# Patient Record
Sex: Female | Born: 1935 | Race: White | Smoking: Never smoker
Health system: Southern US, Community
[De-identification: ages and names within clinical notes are randomized; demographics above are authoritative.]

## PROBLEM LIST (undated history)

## (undated) DIAGNOSIS — K5900 Constipation, unspecified: Secondary | ICD-10-CM

## (undated) DIAGNOSIS — F0390 Unspecified dementia without behavioral disturbance: Secondary | ICD-10-CM

## (undated) DIAGNOSIS — M329 Systemic lupus erythematosus, unspecified: Secondary | ICD-10-CM

## (undated) DIAGNOSIS — N200 Calculus of kidney: Secondary | ICD-10-CM

## (undated) DIAGNOSIS — F32A Depression, unspecified: Secondary | ICD-10-CM

## (undated) DIAGNOSIS — F329 Major depressive disorder, single episode, unspecified: Secondary | ICD-10-CM

## (undated) DIAGNOSIS — R000 Tachycardia, unspecified: Secondary | ICD-10-CM

---

## 1998-05-12 HISTORY — PX: CATARACT EXTRACTION: SUR2

## 2002-05-12 HISTORY — PX: AORTIC VALVE REPLACEMENT: SHX41

## 2003-07-28 ENCOUNTER — Other Ambulatory Visit: Payer: Self-pay

## 2004-07-25 ENCOUNTER — Ambulatory Visit: Payer: Self-pay | Admitting: Internal Medicine

## 2004-08-28 ENCOUNTER — Ambulatory Visit: Payer: Self-pay | Admitting: Internal Medicine

## 2004-09-05 ENCOUNTER — Ambulatory Visit: Payer: Self-pay | Admitting: Psychiatry

## 2005-10-07 ENCOUNTER — Ambulatory Visit: Payer: Self-pay | Admitting: Internal Medicine

## 2006-06-01 ENCOUNTER — Ambulatory Visit: Payer: Self-pay | Admitting: Orthopaedic Surgery

## 2006-10-13 ENCOUNTER — Ambulatory Visit: Payer: Self-pay | Admitting: Internal Medicine

## 2006-11-11 ENCOUNTER — Ambulatory Visit: Payer: Self-pay | Admitting: Orthopaedic Surgery

## 2007-10-15 ENCOUNTER — Ambulatory Visit: Payer: Self-pay | Admitting: Internal Medicine

## 2008-10-16 ENCOUNTER — Ambulatory Visit: Payer: Self-pay | Admitting: Internal Medicine

## 2008-10-18 ENCOUNTER — Ambulatory Visit: Payer: Self-pay | Admitting: Internal Medicine

## 2010-06-12 DIAGNOSIS — I872 Venous insufficiency (chronic) (peripheral): Secondary | ICD-10-CM

## 2010-06-12 DIAGNOSIS — F0280 Dementia in other diseases classified elsewhere without behavioral disturbance: Secondary | ICD-10-CM

## 2010-06-12 DIAGNOSIS — G309 Alzheimer's disease, unspecified: Secondary | ICD-10-CM

## 2010-06-12 DIAGNOSIS — F411 Generalized anxiety disorder: Secondary | ICD-10-CM

## 2010-06-14 DIAGNOSIS — L03039 Cellulitis of unspecified toe: Secondary | ICD-10-CM

## 2010-06-14 DIAGNOSIS — L02619 Cutaneous abscess of unspecified foot: Secondary | ICD-10-CM

## 2010-07-17 ENCOUNTER — Telehealth: Payer: Self-pay | Admitting: Family Medicine

## 2010-07-17 DIAGNOSIS — I10 Essential (primary) hypertension: Secondary | ICD-10-CM

## 2010-07-23 NOTE — Progress Notes (Signed)
Summary: call a nurse   Phone Note Call from Patient   Summary of Call: Triage Record Num: [id number] Operator: Audelia Hives Patient Name: Kathryn Higgins Call Date & Time: 07/16/2010 10:22:58PM Patient Phone: [phone] PCP: Tillman Abide Patient Gender: Female PCP Fax : [phone] Patient DOB: 27-Oct-1923 Practice Name: [facility] Reason for Call: F/U call done, pts BP is 105/72 pulse 117. Clonidine 0.1 mg was given at 2020. At 2120 BP 110/73, no facial flushing noted. Advised to F/U with rounding MD 07/17/10. Protocol(s) Used: Office Note Recommended Outcome per Protocol: Information Noted and Sent to Office Reason for Outcome: Caller information to office Care Advice:  ~ 03/ Initial call taken by: Melody Comas,  July 17, 2010 8:37 AM    Noted.  Will follow up today. Ruthe Mannan MD  July 17, 2010 8:40 AM

## 2010-07-24 DIAGNOSIS — R000 Tachycardia, unspecified: Secondary | ICD-10-CM

## 2010-08-23 DIAGNOSIS — F329 Major depressive disorder, single episode, unspecified: Secondary | ICD-10-CM

## 2010-08-23 DIAGNOSIS — F0281 Dementia in other diseases classified elsewhere with behavioral disturbance: Secondary | ICD-10-CM

## 2010-08-23 DIAGNOSIS — I479 Paroxysmal tachycardia, unspecified: Secondary | ICD-10-CM

## 2010-10-08 DIAGNOSIS — F0280 Dementia in other diseases classified elsewhere without behavioral disturbance: Secondary | ICD-10-CM

## 2010-10-08 DIAGNOSIS — G309 Alzheimer's disease, unspecified: Secondary | ICD-10-CM

## 2010-10-08 DIAGNOSIS — F411 Generalized anxiety disorder: Secondary | ICD-10-CM

## 2010-10-08 DIAGNOSIS — M329 Systemic lupus erythematosus, unspecified: Secondary | ICD-10-CM

## 2010-10-08 DIAGNOSIS — F22 Delusional disorders: Secondary | ICD-10-CM

## 2010-11-14 DIAGNOSIS — J069 Acute upper respiratory infection, unspecified: Secondary | ICD-10-CM

## 2010-11-18 DIAGNOSIS — N6481 Ptosis of breast: Secondary | ICD-10-CM

## 2010-11-27 ENCOUNTER — Ambulatory Visit: Payer: Self-pay | Admitting: Internal Medicine

## 2010-12-06 DIAGNOSIS — J069 Acute upper respiratory infection, unspecified: Secondary | ICD-10-CM

## 2010-12-09 DIAGNOSIS — J069 Acute upper respiratory infection, unspecified: Secondary | ICD-10-CM

## 2011-02-03 DIAGNOSIS — R000 Tachycardia, unspecified: Secondary | ICD-10-CM

## 2011-02-03 DIAGNOSIS — F411 Generalized anxiety disorder: Secondary | ICD-10-CM

## 2011-02-03 DIAGNOSIS — G309 Alzheimer's disease, unspecified: Secondary | ICD-10-CM

## 2011-02-03 DIAGNOSIS — F22 Delusional disorders: Secondary | ICD-10-CM

## 2011-02-03 DIAGNOSIS — F0280 Dementia in other diseases classified elsewhere without behavioral disturbance: Secondary | ICD-10-CM

## 2011-04-04 ENCOUNTER — Telehealth: Payer: Self-pay | Admitting: *Deleted

## 2011-04-04 NOTE — Telephone Encounter (Signed)
Prior Kathryn Higgins is needed for namenda, form is on your desk.  I dont know who originated this.

## 2011-04-07 DIAGNOSIS — M79609 Pain in unspecified limb: Secondary | ICD-10-CM

## 2011-04-07 NOTE — Telephone Encounter (Signed)
Form faxed back and to [facility].

## 2011-04-07 NOTE — Telephone Encounter (Signed)
Form done Please fax back and fax copy to [employer] also

## 2011-04-16 DIAGNOSIS — F329 Major depressive disorder, single episode, unspecified: Secondary | ICD-10-CM

## 2011-06-03 DIAGNOSIS — F22 Delusional disorders: Secondary | ICD-10-CM

## 2011-06-03 DIAGNOSIS — G309 Alzheimer's disease, unspecified: Secondary | ICD-10-CM

## 2011-06-03 DIAGNOSIS — F411 Generalized anxiety disorder: Secondary | ICD-10-CM

## 2011-06-03 DIAGNOSIS — R000 Tachycardia, unspecified: Secondary | ICD-10-CM

## 2011-06-03 DIAGNOSIS — F0280 Dementia in other diseases classified elsewhere without behavioral disturbance: Secondary | ICD-10-CM

## 2011-07-18 DIAGNOSIS — F0281 Dementia in other diseases classified elsewhere with behavioral disturbance: Secondary | ICD-10-CM

## 2011-07-18 DIAGNOSIS — F0280 Dementia in other diseases classified elsewhere without behavioral disturbance: Secondary | ICD-10-CM

## 2011-07-18 DIAGNOSIS — F411 Generalized anxiety disorder: Secondary | ICD-10-CM

## 2011-08-06 ENCOUNTER — Inpatient Hospital Stay: Payer: Self-pay | Admitting: Orthopedic Surgery

## 2011-08-06 LAB — URINALYSIS, COMPLETE
Bilirubin,UR: NEGATIVE
Glucose,UR: NEGATIVE mg/dL (ref 0–75)
Ketone: NEGATIVE
Ph: 6 (ref 4.5–8.0)
Protein: NEGATIVE
RBC,UR: 2 /HPF (ref 0–5)
Squamous Epithelial: 2
WBC UR: 30 /HPF (ref 0–5)

## 2011-08-06 LAB — COMPREHENSIVE METABOLIC PANEL
Albumin: 3.4 g/dL (ref 3.4–5.0)
Calcium, Total: 8.3 mg/dL — ABNORMAL LOW (ref 8.5–10.1)
Chloride: 102 mmol/L (ref 98–107)
Co2: 27 mmol/L (ref 21–32)
EGFR (African American): >60
Osmolality: 275 (ref 275–301)
Potassium: 4.1 mmol/L (ref 3.5–5.1)
SGPT (ALT): 16 U/L
Total Protein: 7.2 g/dL (ref 6.4–8.2)

## 2011-08-06 LAB — APTT: Activated PTT: 29.3 secs (ref 23.6–35.9)

## 2011-08-06 LAB — CBC
HCT: 37.9 % (ref 35.0–47.0)
HGB: 12.8 g/dL (ref 12.0–16.0)
MCHC: 33.6 g/dL (ref 32.0–36.0)
MCV: 100 fL (ref 80–100)
RDW: 14.2 % (ref 11.5–14.5)
WBC: 5.6 10*3/uL (ref 3.6–11.0)

## 2011-08-06 LAB — PROTIME-INR: Prothrombin Time: 12.9 secs (ref 11.5–14.7)

## 2011-08-07 ENCOUNTER — Other Ambulatory Visit: Payer: Medicare Other

## 2011-08-07 LAB — BASIC METABOLIC PANEL
Anion Gap: 8 (ref 7–16)
BUN: 12 mg/dL (ref 7–18)
Calcium, Total: 8.1 mg/dL — ABNORMAL LOW (ref 8.5–10.1)
Creatinine: 0.69 mg/dL (ref 0.60–1.30)
EGFR (African American): >60
Osmolality: 276 (ref 275–301)
Sodium: 138 mmol/L (ref 136–145)

## 2011-08-07 LAB — CBC WITH DIFFERENTIAL/PLATELET
Basophil #: 0 10*3/uL (ref 0.0–0.1)
Basophil %: 0.5 %
Basophil %: 0.1 %
Eosinophil #: 0 10*3/uL (ref 0.0–0.7)
Eosinophil #: 0 10*3/uL (ref 0.0–0.7)
Eosinophil %: 0.3 %
HCT: 37.3 % (ref 35.0–47.0)
HGB: 12.2 g/dL (ref 12.0–16.0)
HGB: 12.5 g/dL (ref 12.0–16.0)
Lymphocyte #: 1.3 10*3/uL (ref 1.0–3.6)
Lymphocyte #: 0.9 10*3/uL — ABNORMAL LOW (ref 1.0–3.6)
Lymphocyte %: 20.9 %
Lymphocyte %: 9.6 %
MCH: 32.9 pg (ref 26.0–34.0)
MCH: 33.2 pg (ref 26.0–34.0)
MCHC: 33.5 g/dL (ref 32.0–36.0)
MCV: 99 fL (ref 80–100)
MCV: 99 fL (ref 80–100)
Monocyte %: 7.7 %
Neutrophil #: 7.6 10*3/uL — ABNORMAL HIGH (ref 1.4–6.5)
Neutrophil %: 68 %
Neutrophil %: 82.3 %
Platelet: 131 10*3/uL — ABNORMAL LOW (ref 150–440)
RBC: 3.7 10*6/uL — ABNORMAL LOW (ref 3.80–5.20)
RDW: 14.3 % (ref 11.5–14.5)

## 2011-08-08 LAB — BASIC METABOLIC PANEL
BUN: 9 mg/dL (ref 7–18)
Chloride: 99 mmol/L (ref 98–107)
Creatinine: 0.72 mg/dL (ref 0.60–1.30)
EGFR (African American): >60
Osmolality: 271 (ref 275–301)
Potassium: 3.9 mmol/L (ref 3.5–5.1)

## 2011-08-08 LAB — CBC WITH DIFFERENTIAL/PLATELET
Eosinophil #: 0 10*3/uL (ref 0.0–0.7)
Eosinophil %: 0.5 %
HCT: 33.8 % — ABNORMAL LOW (ref 35.0–47.0)
HGB: 11.2 g/dL — ABNORMAL LOW (ref 12.0–16.0)
Lymphocyte #: 1.1 10*3/uL (ref 1.0–3.6)
Lymphocyte %: 13.1 %
MCH: 33 pg (ref 26.0–34.0)
MCHC: 33.2 g/dL (ref 32.0–36.0)
Monocyte #: 0.7 10*3/uL (ref 0.0–0.7)
Neutrophil #: 6.3 10*3/uL (ref 1.4–6.5)
Neutrophil %: 77.2 %
RBC: 3.4 10*6/uL — ABNORMAL LOW (ref 3.80–5.20)
RDW: 14.2 % (ref 11.5–14.5)
WBC: 8.2 10*3/uL (ref 3.6–11.0)

## 2011-08-09 LAB — CBC WITH DIFFERENTIAL/PLATELET
Basophil %: 0.2 %
HCT: 31.6 % — ABNORMAL LOW (ref 35.0–47.0)
HGB: 10.8 g/dL — ABNORMAL LOW (ref 12.0–16.0)
Lymphocyte #: 1.3 10*3/uL (ref 1.0–3.6)
Lymphocyte %: 17.9 %
MCH: 33.5 pg (ref 26.0–34.0)
MCHC: 34 g/dL (ref 32.0–36.0)
MCV: 99 fL (ref 80–100)
Monocyte #: 0.8 10*3/uL — ABNORMAL HIGH (ref 0.0–0.7)
Monocyte %: 10.6 %
Neutrophil #: 5 10*3/uL (ref 1.4–6.5)
RBC: 3.21 10*6/uL — ABNORMAL LOW (ref 3.80–5.20)
WBC: 7.3 10*3/uL (ref 3.6–11.0)

## 2011-08-10 LAB — CBC WITH DIFFERENTIAL/PLATELET
Basophil #: 0 10*3/uL (ref 0.0–0.1)
Basophil %: 0.7 %
HGB: 9.8 g/dL — ABNORMAL LOW (ref 12.0–16.0)
Lymphocyte #: 1.1 10*3/uL (ref 1.0–3.6)
Lymphocyte %: 18.8 %
MCHC: 33.3 g/dL (ref 32.0–36.0)
Neutrophil #: 3.9 10*3/uL (ref 1.4–6.5)
RBC: 2.96 10*6/uL — ABNORMAL LOW (ref 3.80–5.20)
WBC: 6 10*3/uL (ref 3.6–11.0)

## 2011-08-11 LAB — URINALYSIS, COMPLETE
Bacteria: NONE SEEN
Hyaline Cast: 2
Ketone: NEGATIVE
Ph: 6 (ref 4.5–8.0)
Protein: 100
Specific Gravity: 1.027 (ref 1.003–1.030)
Squamous Epithelial: 1
Transitional Epi: 2
WBC UR: 157 /HPF (ref 0–5)

## 2011-08-12 LAB — BASIC METABOLIC PANEL
Anion Gap: 9 (ref 7–16)
BUN: 12 mg/dL (ref 7–18)
Calcium, Total: 8.1 mg/dL — ABNORMAL LOW (ref 8.5–10.1)
Potassium: 3.6 mmol/L (ref 3.5–5.1)
Sodium: 136 mmol/L (ref 136–145)

## 2011-08-12 LAB — CBC WITH DIFFERENTIAL/PLATELET
Basophil %: 0.3 %
Eosinophil #: 0 10*3/uL (ref 0.0–0.7)
Eosinophil %: 0.1 %
Lymphocyte #: 0.8 10*3/uL — ABNORMAL LOW (ref 1.0–3.6)
MCH: 33.1 pg (ref 26.0–34.0)
MCV: 99 fL (ref 80–100)
Monocyte #: 0.4 10*3/uL (ref 0.0–0.7)
Monocyte %: 8 %
Neutrophil #: 4 10*3/uL (ref 1.4–6.5)
Neutrophil %: 76.8 %
Platelet: 156 10*3/uL (ref 150–440)
RBC: 3.06 10*6/uL — ABNORMAL LOW (ref 3.80–5.20)

## 2011-08-13 LAB — URINE CULTURE

## 2011-08-14 DIAGNOSIS — G309 Alzheimer's disease, unspecified: Secondary | ICD-10-CM

## 2011-08-14 DIAGNOSIS — S7290XA Unspecified fracture of unspecified femur, initial encounter for closed fracture: Secondary | ICD-10-CM

## 2011-08-14 DIAGNOSIS — F0280 Dementia in other diseases classified elsewhere without behavioral disturbance: Secondary | ICD-10-CM

## 2011-09-01 ENCOUNTER — Other Ambulatory Visit: Payer: Medicare Other

## 2011-09-18 DIAGNOSIS — F323 Major depressive disorder, single episode, severe with psychotic features: Secondary | ICD-10-CM

## 2011-09-18 DIAGNOSIS — G309 Alzheimer's disease, unspecified: Secondary | ICD-10-CM

## 2011-09-18 DIAGNOSIS — F411 Generalized anxiety disorder: Secondary | ICD-10-CM

## 2011-09-18 DIAGNOSIS — F0280 Dementia in other diseases classified elsewhere without behavioral disturbance: Secondary | ICD-10-CM

## 2011-09-26 ENCOUNTER — Ambulatory Visit: Payer: Self-pay | Admitting: Internal Medicine

## 2011-09-26 ENCOUNTER — Encounter: Payer: Self-pay | Admitting: Internal Medicine

## 2011-12-03 DIAGNOSIS — F411 Generalized anxiety disorder: Secondary | ICD-10-CM

## 2011-12-05 DIAGNOSIS — R262 Difficulty in walking, not elsewhere classified: Secondary | ICD-10-CM

## 2011-12-05 DIAGNOSIS — F411 Generalized anxiety disorder: Secondary | ICD-10-CM

## 2011-12-05 DIAGNOSIS — S72009A Fracture of unspecified part of neck of unspecified femur, initial encounter for closed fracture: Secondary | ICD-10-CM

## 2011-12-05 DIAGNOSIS — F0280 Dementia in other diseases classified elsewhere without behavioral disturbance: Secondary | ICD-10-CM

## 2012-01-03 ENCOUNTER — Emergency Department: Payer: Self-pay | Admitting: Emergency Medicine

## 2012-01-03 LAB — BASIC METABOLIC PANEL
Anion Gap: 6 — ABNORMAL LOW (ref 7–16)
Calcium, Total: 8.8 mg/dL (ref 8.5–10.1)
Chloride: 105 mmol/L (ref 98–107)
Co2: 27 mmol/L (ref 21–32)
Osmolality: 277 (ref 275–301)

## 2012-01-03 LAB — URINALYSIS, COMPLETE
Bilirubin,UR: NEGATIVE
Glucose,UR: NEGATIVE mg/dL (ref 0–75)
Hyaline Cast: 14
Ph: 6 (ref 4.5–8.0)
Squamous Epithelial: NONE SEEN
Transitional Epi: 1

## 2012-01-03 LAB — CBC
MCH: 32.5 pg (ref 26.0–34.0)
Platelet: 183 10*3/uL (ref 150–440)
RBC: 4.56 10*6/uL (ref 3.80–5.20)
WBC: 7.3 10*3/uL (ref 3.6–11.0)

## 2012-01-05 ENCOUNTER — Telehealth: Payer: Self-pay | Admitting: Internal Medicine

## 2012-01-05 NOTE — Telephone Encounter (Signed)
Triage Record Num: [id number] Operator: Rebeca Allegra Patient Name: Kathryn Higgins Call Date & Time: 01/03/2012 10:55:34AM Patient Phone: [phone] PCP: Tillman Abide Patient Gender: Female PCP Fax : [phone] Patient DOB: [birth year] [facility] Name: [facility] Reason for Call: Caller: Levon Hedger; PCP: Tillman Abide (Family Practice); CB#: [phone]; Call regarding Fall; 01/03/12 1040. Patient complains of left wrist pain and swelling. Patient has vomiting x3. Olegario Messier reports that the facility called 911 while on hold waiting to speak with a triage nurse. BP 126/84 88 RR16 97.5. Protocol(s) Used: Office Note Recommended Outcome per Protocol: Information Noted and Sent to Office Reason for Outcome: Caller information to office Care Advice: ~ 01/03/2012 11:01:53AM Page 1 of 1 [email or web address]_[email or web address]

## 2012-01-06 NOTE — Telephone Encounter (Signed)
Will follow up at [facility] 

## 2012-01-08 DIAGNOSIS — B370 Candidal stomatitis: Secondary | ICD-10-CM

## 2012-01-13 ENCOUNTER — Telehealth: Payer: Self-pay | Admitting: Internal Medicine

## 2012-01-13 NOTE — Telephone Encounter (Signed)
Triage Record Num: [id number] Operator: Hillary Bow Patient Name: Kathryn Higgins Call Date & Time: 01/10/2012 2:07:23PM Patient Phone: [phone] PCP: Tillman Abide Patient Gender: Female PCP Fax : [phone] Patient DOB: [birth year] Practice Name: [facility] Reason for Call: Caller: Kathy/RN; [facility] Simpson, PCP: Tillman Abide [facility]); CB#: [phone]; Call regarding Magic mouthwash needed; Per RN, Patient has white patches on tongue and mouth, onset 8-29. Patient doesn't eat due to pain. Patient takes Diflucan for confirmed Thrush. Patient is drinking water and urinating normally. All emergent symptoms ruled out per Mouth Lesions Protocol. Consulted with Dr Jyl Heinz, verbal order for Magic Mouth Wash, 5cc swish and spit, quantity efficient. RN verbalized understanding. Protocol(s) Used: Mouth Lesions Recommended Outcome per Protocol: See Provider within 24 hours Reason for Outcome: Painful pale yellow or white spot(s) on the lining of the mouth, gums, or tongue unresponsive to 72 hours of home care or that are unusually large Care Advice: ~ 01/10/2012 2:34:19PM Page 1 of 1 [email or web address]_[email or web address]_[email or web address]

## 2012-01-16 DIAGNOSIS — B370 Candidal stomatitis: Secondary | ICD-10-CM

## 2012-01-27 DIAGNOSIS — F0280 Dementia in other diseases classified elsewhere without behavioral disturbance: Secondary | ICD-10-CM

## 2012-01-27 DIAGNOSIS — R000 Tachycardia, unspecified: Secondary | ICD-10-CM

## 2012-01-27 DIAGNOSIS — G309 Alzheimer's disease, unspecified: Secondary | ICD-10-CM

## 2012-01-27 DIAGNOSIS — F411 Generalized anxiety disorder: Secondary | ICD-10-CM

## 2012-01-27 DIAGNOSIS — F329 Major depressive disorder, single episode, unspecified: Secondary | ICD-10-CM

## 2012-04-02 DIAGNOSIS — S72009A Fracture of unspecified part of neck of unspecified femur, initial encounter for closed fracture: Secondary | ICD-10-CM

## 2012-04-02 DIAGNOSIS — F411 Generalized anxiety disorder: Secondary | ICD-10-CM

## 2012-04-02 DIAGNOSIS — F0280 Dementia in other diseases classified elsewhere without behavioral disturbance: Secondary | ICD-10-CM

## 2012-04-02 DIAGNOSIS — R262 Difficulty in walking, not elsewhere classified: Secondary | ICD-10-CM

## 2012-06-01 DIAGNOSIS — F329 Major depressive disorder, single episode, unspecified: Secondary | ICD-10-CM

## 2012-06-01 DIAGNOSIS — G309 Alzheimer's disease, unspecified: Secondary | ICD-10-CM

## 2012-06-01 DIAGNOSIS — M810 Age-related osteoporosis without current pathological fracture: Secondary | ICD-10-CM

## 2012-06-01 DIAGNOSIS — R000 Tachycardia, unspecified: Secondary | ICD-10-CM

## 2012-06-01 DIAGNOSIS — F0280 Dementia in other diseases classified elsewhere without behavioral disturbance: Secondary | ICD-10-CM

## 2012-07-12 DIAGNOSIS — G309 Alzheimer's disease, unspecified: Secondary | ICD-10-CM

## 2012-07-12 DIAGNOSIS — R000 Tachycardia, unspecified: Secondary | ICD-10-CM

## 2012-07-12 DIAGNOSIS — F0280 Dementia in other diseases classified elsewhere without behavioral disturbance: Secondary | ICD-10-CM

## 2012-07-12 DIAGNOSIS — F329 Major depressive disorder, single episode, unspecified: Secondary | ICD-10-CM

## 2012-07-12 DIAGNOSIS — M810 Age-related osteoporosis without current pathological fracture: Secondary | ICD-10-CM

## 2012-10-29 DIAGNOSIS — F329 Major depressive disorder, single episode, unspecified: Secondary | ICD-10-CM

## 2012-11-29 DIAGNOSIS — F329 Major depressive disorder, single episode, unspecified: Secondary | ICD-10-CM

## 2012-11-29 DIAGNOSIS — L930 Discoid lupus erythematosus: Secondary | ICD-10-CM

## 2012-11-29 DIAGNOSIS — F0280 Dementia in other diseases classified elsewhere without behavioral disturbance: Secondary | ICD-10-CM

## 2012-11-29 DIAGNOSIS — G309 Alzheimer's disease, unspecified: Secondary | ICD-10-CM

## 2012-11-29 DIAGNOSIS — R002 Palpitations: Secondary | ICD-10-CM

## 2013-01-19 DIAGNOSIS — F329 Major depressive disorder, single episode, unspecified: Secondary | ICD-10-CM

## 2013-01-19 DIAGNOSIS — L930 Discoid lupus erythematosus: Secondary | ICD-10-CM

## 2013-01-19 DIAGNOSIS — F0280 Dementia in other diseases classified elsewhere without behavioral disturbance: Secondary | ICD-10-CM

## 2013-01-19 DIAGNOSIS — R000 Tachycardia, unspecified: Secondary | ICD-10-CM

## 2013-01-19 DIAGNOSIS — G309 Alzheimer's disease, unspecified: Secondary | ICD-10-CM

## 2013-03-28 DIAGNOSIS — G309 Alzheimer's disease, unspecified: Secondary | ICD-10-CM

## 2013-03-28 DIAGNOSIS — F329 Major depressive disorder, single episode, unspecified: Secondary | ICD-10-CM

## 2013-03-28 DIAGNOSIS — R000 Tachycardia, unspecified: Secondary | ICD-10-CM

## 2013-03-28 DIAGNOSIS — L930 Discoid lupus erythematosus: Secondary | ICD-10-CM

## 2013-03-28 DIAGNOSIS — F0280 Dementia in other diseases classified elsewhere without behavioral disturbance: Secondary | ICD-10-CM

## 2013-04-11 DIAGNOSIS — L989 Disorder of the skin and subcutaneous tissue, unspecified: Secondary | ICD-10-CM

## 2013-05-17 DIAGNOSIS — M810 Age-related osteoporosis without current pathological fracture: Secondary | ICD-10-CM

## 2013-05-17 DIAGNOSIS — G309 Alzheimer's disease, unspecified: Secondary | ICD-10-CM

## 2013-05-17 DIAGNOSIS — F0280 Dementia in other diseases classified elsewhere without behavioral disturbance: Secondary | ICD-10-CM

## 2013-05-17 DIAGNOSIS — L930 Discoid lupus erythematosus: Secondary | ICD-10-CM

## 2013-05-17 DIAGNOSIS — F3289 Other specified depressive episodes: Secondary | ICD-10-CM

## 2013-05-17 DIAGNOSIS — F329 Major depressive disorder, single episode, unspecified: Secondary | ICD-10-CM

## 2013-05-17 DIAGNOSIS — R000 Tachycardia, unspecified: Secondary | ICD-10-CM

## 2013-08-05 DIAGNOSIS — S72009A Fracture of unspecified part of neck of unspecified femur, initial encounter for closed fracture: Secondary | ICD-10-CM

## 2013-08-05 DIAGNOSIS — F0280 Dementia in other diseases classified elsewhere without behavioral disturbance: Secondary | ICD-10-CM

## 2013-08-05 DIAGNOSIS — F411 Generalized anxiety disorder: Secondary | ICD-10-CM

## 2013-08-05 DIAGNOSIS — M329 Systemic lupus erythematosus, unspecified: Secondary | ICD-10-CM

## 2013-08-05 DIAGNOSIS — Z954 Presence of other heart-valve replacement: Secondary | ICD-10-CM

## 2013-09-13 DIAGNOSIS — I4891 Unspecified atrial fibrillation: Secondary | ICD-10-CM

## 2013-09-13 DIAGNOSIS — L930 Discoid lupus erythematosus: Secondary | ICD-10-CM

## 2013-09-13 DIAGNOSIS — F329 Major depressive disorder, single episode, unspecified: Secondary | ICD-10-CM

## 2013-09-13 DIAGNOSIS — R633 Feeding difficulties, unspecified: Secondary | ICD-10-CM

## 2013-09-13 DIAGNOSIS — F3289 Other specified depressive episodes: Secondary | ICD-10-CM

## 2013-09-13 DIAGNOSIS — M810 Age-related osteoporosis without current pathological fracture: Secondary | ICD-10-CM

## 2013-09-13 DIAGNOSIS — F0280 Dementia in other diseases classified elsewhere without behavioral disturbance: Secondary | ICD-10-CM

## 2013-09-13 DIAGNOSIS — G309 Alzheimer's disease, unspecified: Secondary | ICD-10-CM

## 2013-12-05 DIAGNOSIS — F3289 Other specified depressive episodes: Secondary | ICD-10-CM

## 2013-12-05 DIAGNOSIS — G309 Alzheimer's disease, unspecified: Secondary | ICD-10-CM

## 2013-12-05 DIAGNOSIS — L930 Discoid lupus erythematosus: Secondary | ICD-10-CM

## 2013-12-05 DIAGNOSIS — F329 Major depressive disorder, single episode, unspecified: Secondary | ICD-10-CM

## 2013-12-05 DIAGNOSIS — F0280 Dementia in other diseases classified elsewhere without behavioral disturbance: Secondary | ICD-10-CM

## 2013-12-05 DIAGNOSIS — R000 Tachycardia, unspecified: Secondary | ICD-10-CM

## 2014-01-25 DIAGNOSIS — R000 Tachycardia, unspecified: Secondary | ICD-10-CM | POA: Diagnosis not present

## 2014-01-25 DIAGNOSIS — L930 Discoid lupus erythematosus: Secondary | ICD-10-CM

## 2014-01-25 DIAGNOSIS — F0280 Dementia in other diseases classified elsewhere without behavioral disturbance: Secondary | ICD-10-CM

## 2014-01-25 DIAGNOSIS — G309 Alzheimer's disease, unspecified: Secondary | ICD-10-CM

## 2014-01-25 DIAGNOSIS — F3289 Other specified depressive episodes: Secondary | ICD-10-CM | POA: Diagnosis not present

## 2014-01-25 DIAGNOSIS — F329 Major depressive disorder, single episode, unspecified: Secondary | ICD-10-CM | POA: Diagnosis not present

## 2014-03-27 DIAGNOSIS — R000 Tachycardia, unspecified: Secondary | ICD-10-CM

## 2014-03-27 DIAGNOSIS — G301 Alzheimer's disease with late onset: Secondary | ICD-10-CM

## 2014-03-27 DIAGNOSIS — F418 Other specified anxiety disorders: Secondary | ICD-10-CM

## 2014-05-31 DIAGNOSIS — K5900 Constipation, unspecified: Secondary | ICD-10-CM

## 2014-05-31 DIAGNOSIS — I479 Paroxysmal tachycardia, unspecified: Secondary | ICD-10-CM

## 2014-05-31 DIAGNOSIS — F323 Major depressive disorder, single episode, severe with psychotic features: Secondary | ICD-10-CM

## 2014-05-31 DIAGNOSIS — G309 Alzheimer's disease, unspecified: Secondary | ICD-10-CM

## 2014-05-31 DIAGNOSIS — L930 Discoid lupus erythematosus: Secondary | ICD-10-CM

## 2014-07-25 DIAGNOSIS — R000 Tachycardia, unspecified: Secondary | ICD-10-CM | POA: Diagnosis not present

## 2014-07-25 DIAGNOSIS — F329 Major depressive disorder, single episode, unspecified: Secondary | ICD-10-CM

## 2014-07-25 DIAGNOSIS — L930 Discoid lupus erythematosus: Secondary | ICD-10-CM

## 2014-07-25 DIAGNOSIS — G309 Alzheimer's disease, unspecified: Secondary | ICD-10-CM | POA: Diagnosis not present

## 2014-08-08 IMAGING — CR DG HIP COMPLETE 2+V*L*
1 series · 2 of 2 positions shown · non-contrast
Comparison: none

REASON FOR EXAM: fall
COMMENTS:

[Series 1: t hip ap left · 0.14mm/px · 2 of 2 slices shown]
[im 1/2]
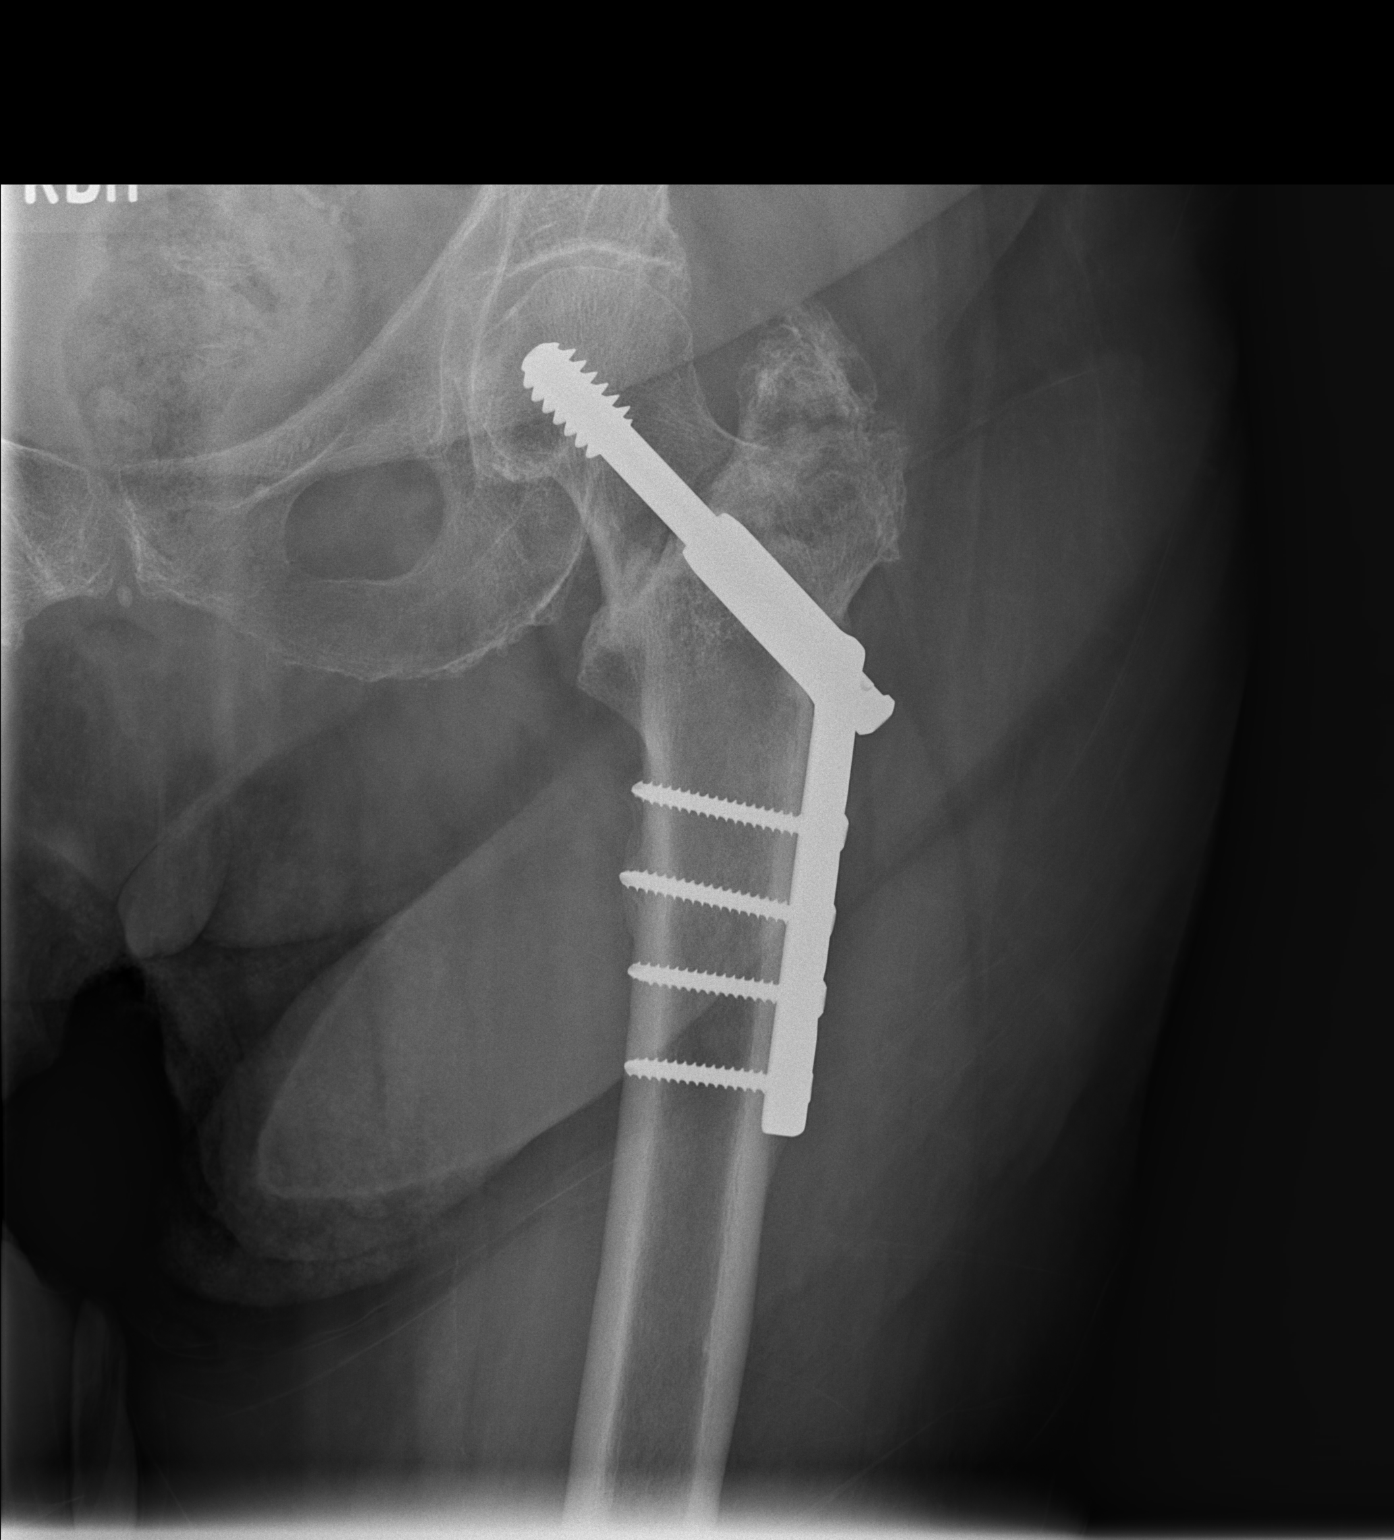
[im 2/2]
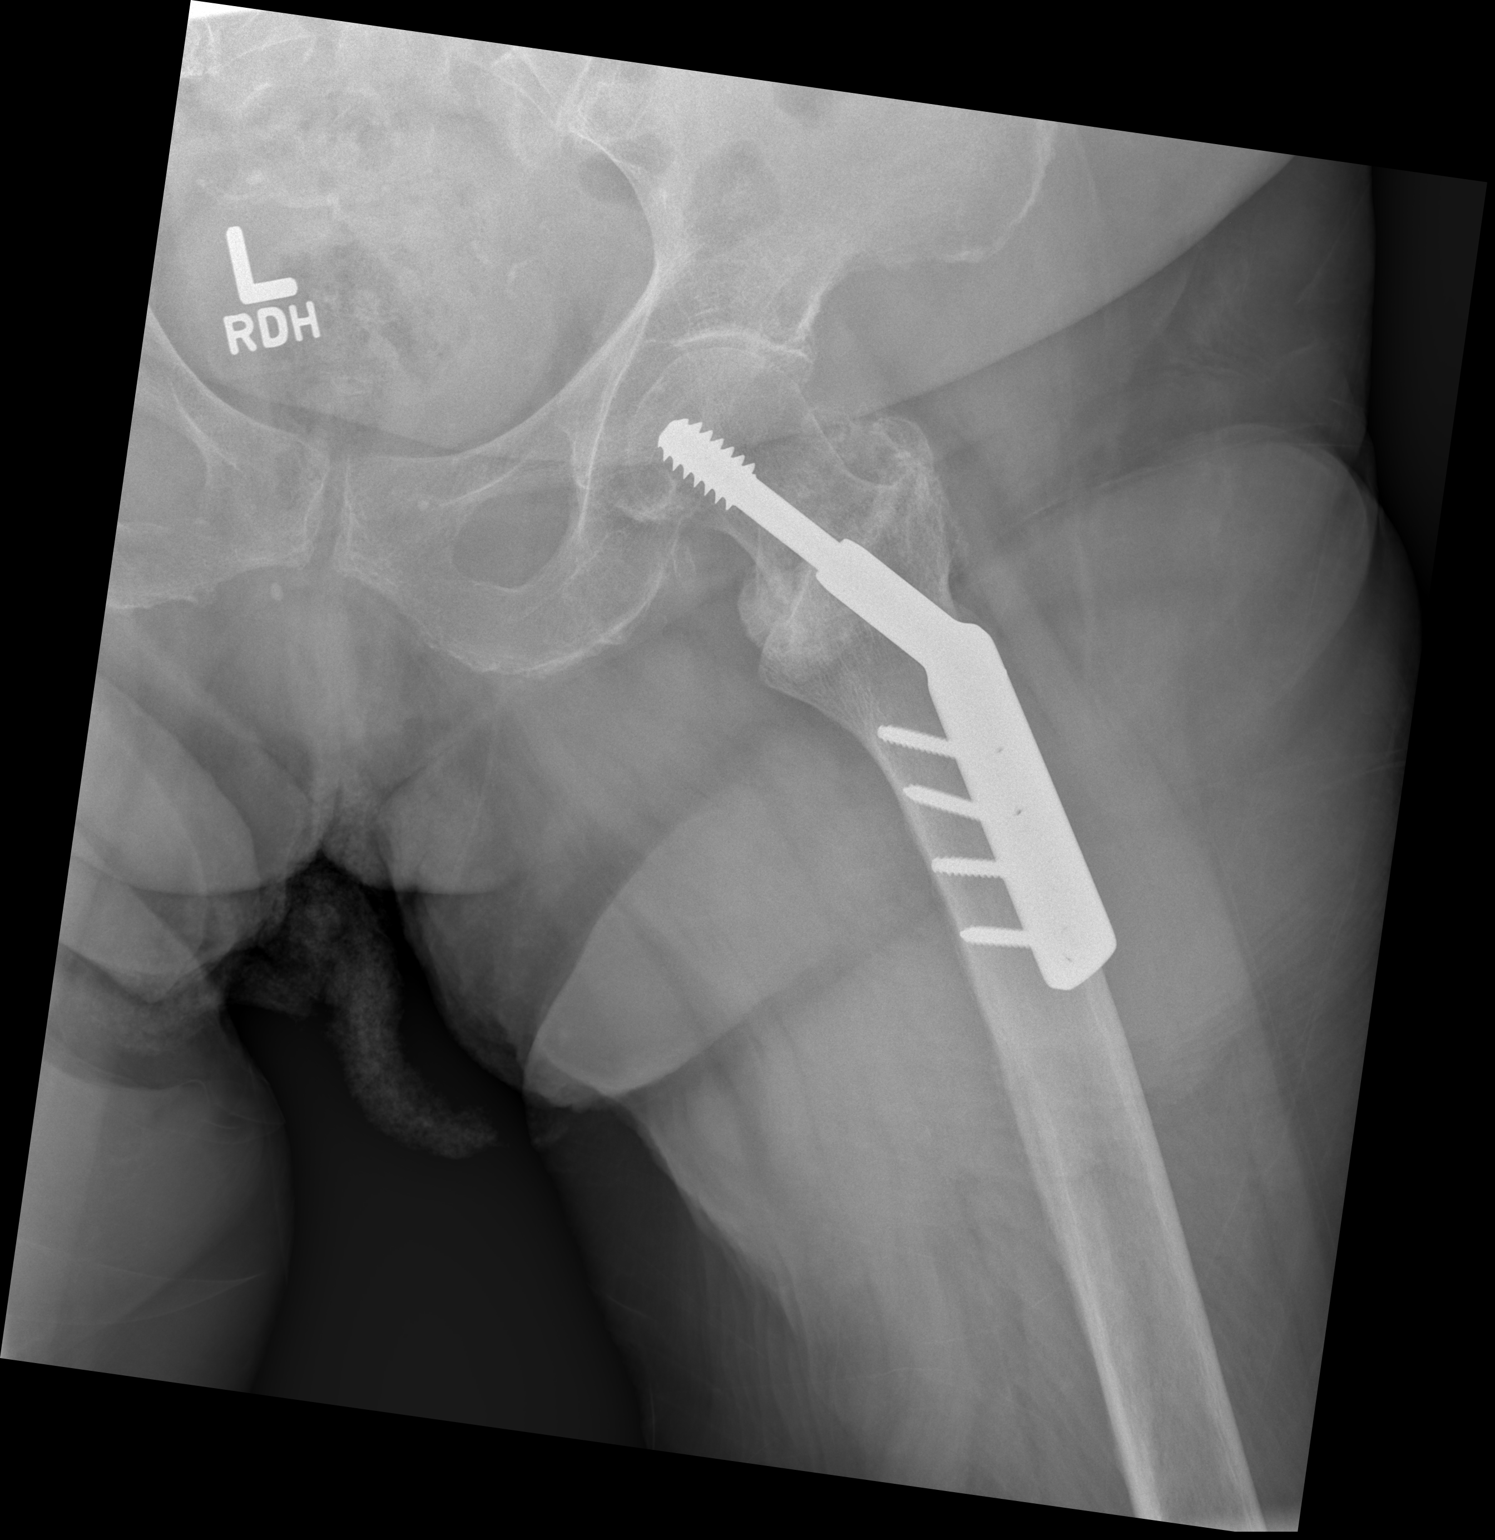

[2 of 2 positions shown; findings below may reference images not displayed]

PROCEDURE:     DXR - DXR HIP LEFT COMPLETE  - January 03, 2012 [DATE]

RESULT:     Patient status post plate and screw fixation of the left hip
fracture. Good anatomic alignment noted. There is a lucency in the greater
trochanter and femoral neck. This may be a remnant from prior fracture noted
on prior study of 08/06/2011. An acute fracture cannot be excluded.
Nevertheless there is no evidence of displacement.
IMPRESSION: See above.

[REDACTED]

## 2014-09-03 NOTE — Discharge Summary (Signed)
PATIENT NAME:  Kathryn Higgins, [name] MR#:  [id number] DATE OF BIRTH:  1924-02-22  DATE OF ADMISSION:  08/06/2011 DATE OF DISCHARGE:  08/12/2011  DISCHARGE DIAGNOSES:  1. Left intertrochanteric and basilar left femoral neck fracture.  2. Postoperative exacerbation of congestive heart failure with acute hypoxia.  3. Postoperative oliguria.  4. Postoperative urinary tract infection.  5. Hypertension.   6. Hyperlipidemia.  7. Systemic lupus erythematosus.  8. Systemic lupus nephritis.  9. Status post aortic valve replacement.  10. Dementia.  11. Osteoporosis.  12. Nephrolithiasis.   OPERATIONS/PROCEDURES PERFORMED: Open reduction internal fixation left hip fracture with Synthes DHS compression screw.   HISTORY AND PHYSICAL EXAMINATION: As dictated on admission by Dr. Martha ClanKrasinski.   LABORATORY DATA: As noted on the chart.   HOSPITAL COURSE: The patient was admitted and appropriate laboratory data obtained. The patient was seen in evaluation by [facility] for medical management and clearance for surgery. Medical clearance was given, and the patient was taken to the Operating Room the following day where open reduction and internal fixation of her left hip fracture was performed with a Synthes DHS compression screw. The patient tolerated the procedure well. Postoperatively, she initially was reluctant to get up out of bed and then on postoperative day two obtained pain control. Her wound was seen to be healing in quite well at all times with only minimal drainage. She did develop acute hypoxia associated with oliguria, and chest x-ray demonstrated changes of congestive heart failure. This was treated as noted in the chart by [facility] with diuresis, prednisone and oxygen therapy. She also was noted to have a urinary tract infection and was placed on Levaquin 500 mg per day. She generally improved with her medical treatment, and by 08/12/2011 she was seen to be doing well and taking foods well without any  problems. She was minimally tender on getting up into the chair, out of bed and physical therapy. She is discharged back to [facility] on 08/13/2011.   DISCHARGE INSTRUCTIONS:  1. Her orders and medications are as on the printed order sheet. 2. Please discontinue the Lovenox in 10 days and at that point start her back on aspirin 81 mg per day.  3. Also, please discontinue her Levaquin in 8 days and following that repeat her urine culture.  4. If her wound is seen to be healing in well in 10 days, remove her staples and apply Steri-Strips. 5. She is to return to the office to see Dr. Martha ClanKrasinski in three weeks.   ____________________________ Clare Gandyhristopher E. Kwasi Joung, MD ces:cbb D: 08/12/2011 17:56:47 ET T: 08/12/2011 18:21:12 ET JOB#: [id number]  cc: Clare Gandyhristopher E. Tomio Kirk, MD, <Dictator> Clare GandyHRISTOPHER E Yaritza Leist MD ELECTRONICALLY SIGNED 08/13/2011 6:36

## 2014-09-03 NOTE — Consult Note (Signed)
Brief Consult Note: Diagnosis: Preop Evaluation, h/o Bioprosthetic AVR, h/o htn, hyperlipidemia, dementia, SLE.   Patient was seen by consultant.   Consult note dictated.   Recommend to proceed with surgery or procedure.   Orders entered.   Comments: 88/f h/o HTN , hyperlipidemia,  h/o AVR with Carpentier- Edwards  valve, SLE, h/o lupus nephritis, dementia, [facility] presented after fall sustaining left femoral neck fracture, no h/o CAD, EKG normal, no acute ischemic changes, no cardiac symptoms , low cardiac risk for procedure cont cardizem bid for rate control cont plaquenil for SLE, renal function normal hold asa for surgery.  Electronic Signatures: Fredia SorrowGupta, Nitza Schmid (MD)  (Signed 27-Mar-13 16:32)  Authored: Brief Consult Note   Last Updated: 27-Mar-13 16:32 by Fredia SorrowGupta, Sriya Kroeze (MD)

## 2014-09-03 NOTE — Op Note (Signed)
PATIENT NAME:  Kathryn Higgins, Kathryn Higgins MR#:  [id number] DATE OF BIRTH:  [birth year]  DATE OF PROCEDURE:  08/07/2011  PREOPERATIVE DIAGNOSIS: Left intertrochanteric hip fracture.   POSTOPERATIVE DIAGNOSIS: Left intertrochanteric hip fracture.   PROCEDURE: Open reduction internal fixation left intertrochanteric hip fracture with Synthes DHS compression screw and sideplate.   SURGEON: Timoteo Gaul, MD  ANESTHESIA: General.   ESTIMATED BLOOD LOSS: 150 mL.  FLUIDS: 600 mL.  URINE OUTPUT: 120 mL.  IMPLANTS: Synthes 130 degree four hole DHS plate with a 90 mm 47.0 mm lag screw and four bicortical 4.5 mm screws for sideplate fixation.   INDICATIONS FOR PROCEDURE: Patient is an 81 year old female who lives at the [facility] memory care center. She fell at [facility]. Patient is ambulatory at baseline and was not requiring the use of an assistive device at the time of her fall. Given the fact that she is ambulatory at baseline I have recommended fixation of her left intertrochanteric hip fracture. I reviewed the risks and benefits with the patient's family. Her husband was the power of attorney and signed the consent form after understanding the risks which included infection, bleeding requiring blood transfusion, nerve or blood vessel injury, leg length discrepancy, change in lower extremity rotation, hardware failure, screw cut out, malunion, nonunion, and the need for further surgery including conversion to a total hip arthroplasty. Medical complications include deep vein thrombosis and pulmonary embolism, myocardial infarction, stroke, pneumonia, respiratory failure and death.   PROCEDURE NOTE: Patient was brought to the Operating Room where she was placed supine on a fracture table. She underwent general endotracheal intubation. Patient then had left leg placed in a legholder. Her right leg was placed in a well legholder in a hemi-lithotomy position. All bony prominences were adequately padded.    A timeout was performed to verify the patient's name, date of birth, medical record number, correct site of surgery, and correct procedure to be performed. It was also used to verify the patient had received antibiotics and that all appropriate instruments, implants, and radiographic studies were available in the room. Once all in attendance were in agreement, the case began.   Patient had gentle traction and internal rotation placed on the left hip. Intraoperative C-arm fluoroscopy images were taken in the AP and lateral projections and confirmed near anatomic fracture reduction. Patient was then prepped and draped in a sterile fashion. A #10 blade was used to make a lateral incision based over the lateral trochanter. The fascia lata was then incised using a deep #10 blade. The vastus lateralis was then bluntly dissected to allow visualization of the lateral cortex of the femur.   A 130 degree drill guide was then used to advance a guidepin across the fracture site into the femoral head. Its position was confirmed on AP and lateral projections. A depth gauge was used to measure the length of lag screw and it was found to be 90 mm in length. This was then overdrilled with a long barrel drill bit to allow for placement of the lag screw. The lag screw was then placed over the guidepin and brought into position in the subchondral bone of the femoral head. Again its position was confirmed on AP and lateral C-arm projections.   A four hole Synthes DHS sideplate was then brought alongside the lateral femur and gently malleted into position. Four bicortical screws were then drilled through the sideplate to affix it to the femur. Once the sideplate was fixed to the femur the  traction was let off the left leg. A compression screw was then placed into the base of the lag screw and the fracture was compressed and well reduced. Final C-arm images were taken on the AP and lateral projections.   The wound was  copiously irrigated. The fascia lata was closed with an interrupted 0 Vicryl suture and the subcutaneous tissues were closed in two layers using 0 and #2 Vicryl. The skin was approximated with skin staples and a dry sterile dressing was applied. I was scrubbed and present for the entire case and all sharp and instrument counts were correct at conclusion of the case. Patient was then extubated and brought into the PAC-U in stable condition. I met with the patient's family to let them know patient was stable in recovery room and the case had gone without complication.     ____________________________ Timoteo Gaul, MD klk:cms D: 08/08/2011 13:22:39 ET T: 08/08/2011 13:43:41 ET JOB#: [id number]  cc: Timoteo Gaul, MD, <Dictator> Timoteo Gaul MD ELECTRONICALLY SIGNED 08/14/2011 13:33
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
## 2014-09-03 NOTE — H&P (Signed)
Subjective/Chief Complaint Left hip pain    History of Present Illness 81 y/o woman who lives in the [facility] memory care unit fell today and was unable to stand.  She was brought to the [facility] ER where she was diagnosed with a left intertrochanteric hip fracture by x-ray.  Patient was seen at approximately 6 pm this evening in her room on 1A.  The patient's husband, son and daughter in law are at the bedside.  The patient's husband states that she is ambulatory at baseline and walks without an assist device currently.  He has power of attorney.    Past History AVR - bioprosthetic, hypercholesterolemia, dementia, SLE    Past Medical Health Hypertension   ALLERGIES:  Aricept: Unknown  Exelon: Unknown  Ibuprofen: Unknown  Propranolol: Unknown  HOME MEDICATIONS: Medication Instructions Status  hydroxychloroquine 200 mg oral tablet 1 tab(s) orally once a day at 9am for lupus.  **brand name plaquenil** Active  loratadine 10 mg oral tablet 1 tab(s) orally once a day at 9am for allergy.  **brand name claritin** Active  docusate-senna 50 mg-8.6 mg oral tablet 1 tab(s) orally once a day at 9am for constipation. *drink plenty of water* **brand name senokot-s** Active  aspirin 81 mg oral tablet, chewable 1 tab(s) orally once a day at 9am for anti-platelet aggregation. Active  diltiazem 30 mg oral tablet 1 tab(s) orally 2 times a day (9am, 8pm) for htn/tachycardia. Active  Tylenol 325 mg oral tablet 2 tab(s) orally 3 times a day (9am, 2pm, 8pm) for pain. Active  Namenda 5 mg oral tablet 1 tab(s) orally once a day at 8pm for dementia. Active  Tylenol 325 mg oral tablet 2 tab(s) orally once a day as needed for pain. **no more than 3024m/24h** Active  Celexa 20 mg oral tablet 1 tab(s) orally once a day at 9am. Active  Xanax 0.5 mg oral tablet 1 tab(s) orally 2 times a day as needed for anxiety. Active  Xanax 0.25 mg oral tablet 1 tab(s) orally 2 times a day (9am, 2pm). Active  Abilify 2 mg oral  tablet 1 tab(s) orally once a day at 8pm. Active   Family and Social History:   Family History Non-Contributory    Place of Living Nursing Home   Review of Systems:   Subjective/Chief Complaint Left hip pain   Physical Exam:   GEN NAD    HEENT PERRL, hearing intact to voice, moist oral mucosa, Oropharynx clear, dentures    NECK supple  No masses  trachea midline    RESP normal resp effort  clear BS  no use of accessory muscles    CARD regular rate  No LE edema  no JVD    GU foley catheter in place    LYMPH negative neck    EXTR Left leg is shortened and externally rotated.  Pedal pulses are palpable.  Sensation is intact to light touch throughout.  She can flex and extend her toes on the left foot.    SKIN normal to palpation, No ulcers    NEURO motor/sensory function intact    PSYCH Responds to questions and can follow voice commands.   Routine Hem:  27-Mar-13 14:19    WBC (CBC) 5.6   WBC (CBC) -   RBC (CBC) 3.81   RBC (CBC) -   Hemoglobin (CBC) 12.8   Hemoglobin (CBC) -   Hematocrit (CBC) 37.9   Hematocrit (CBC) -   Platelet Count (CBC) 168   Platelet Count (  CBC) -   MCV 100   MCV -   MCH 33.5   MCH -   MCHC 33.6   MCHC -   RDW 14.2   RDW -  Routine Chem:  27-Mar-13 14:19    Glucose, Serum 99   Glucose, Serum -   BUN 12   BUN -   Creatinine (comp) 0.95   Creatinine (comp) -   Sodium, Serum 138   Sodium, Serum -   Potassium, Serum 4.1   Potassium, Serum -   Chloride, Serum 102   Chloride, Serum -   CO2, Serum 27   CO2, Serum -   Calcium (Total), Serum 8.3   Calcium (Total), Serum -  Hepatic:  27-Mar-13 14:19    Bilirubin, Total 0.5   Alkaline Phosphatase 72   SGPT (ALT) 16   SGOT (AST) 25   Total Protein, Serum 7.2   Albumin, Serum 3.4  Routine Chem:  27-Mar-13 14:19    Osmolality (calc) 275   Osmolality (calc) -   eGFR (African American) >60   eGFR (African American) -   eGFR (Non-African American) 59   eGFR (Non-African  American) -   Anion Gap 9   Anion Gap -  Routine Coag:  27-Mar-13 14:19    Prothrombin 12.9   INR 0.9  Routine BB:  27-Mar-13 14:19    Antibody Screen NEGATIVE   Crossmatch Unit 1 Ready   Crossmatch Unit 2 Ready  Routine Hem:  27-Mar-13 14:19    Neutrophil % -   Lymphocyte % -   Monocyte % -   Eosinophil % -   Basophil % -   Neutrophil # -   Lymphocyte # -   Monocyte # -   Eosinophil # -   Basophil # -   Bands -   Segmented Neutrophils -   Lymphocytes -   Variant Lymphocytes -   Monocytes -   Eosinophil -   Basophil -   Metamyelocyte -   Myelocyte -   Promyelocyte -   Blast-Like -   Other Cells -   NRBC -   Diff Comment 1 -   Diff Comment 2 -   Diff Comment 3 -   Diff Comment 4 -   Diff Comment 5 -   Diff Comment 6 -   Diff Comment 7 -   Diff Comment 8 -   Diff Comment 9 -   Diff Comment 10 -  Routine Coag:  27-Mar-13 14:19    Activated PTT (APTT) 29.3  Routine UA:  27-Mar-13 16:28    Color (UA) Yellow   Clarity (UA) Hazy   Glucose (UA) Negative   Bilirubin (UA) Negative   Ketones (UA) Negative   Specific Gravity (UA) 1.015   Blood (UA) Negative   pH (UA) 6.0   Protein (UA) Negative   Nitrite (UA) Negative   Leukocyte Esterase (UA) 2+   RBC (UA) 2 /HPF   WBC (UA) 30 /HPF   Epithelial Cells (UA) 2 /HPF   Mucous (UA) PRESENT   Radiology Results: XRay:    27-Mar-13 14:59, Chest 1 View AP or PA   Chest 1 View AP or PA   REASON FOR EXAM:    pain following trauma  COMMENTS:       PROCEDURE: DXR - DXR CHEST 1 VIEWAP OR PA  - Aug 06 2011  2:59PM     RESULT: Portable AP view of the chest shows thickening of the lung   markings  bilaterally compatible with chronic inflammatory change. No   definite pneumonia is seen. No pleural effusion is noted. Post operative   changes of prior aortic valve replacement are noted. The heart is upper   limits for normal in size.    IMPRESSION:     1.  No acute changes are identified.  2.  There is thickening  of the lung markings bilaterally compatible with     pulmonary fibrosis.  3.  The patient has had prior aortic valve replacement.      Thank you for this opportunity to contribute to the care of your patient.           VerifiedBy: JACK G. WALL, M.D., MD    27-Mar-13 14:59, Femur Left   Femur Left   REASON FOR EXAM:    pain following trauma  COMMENTS:       PROCEDURE: DXR - DXR FEMUR LEFT  - Aug 06 2011  2:59PM     RESULT: There is a basocervical fracture of the left femoral neck. No   additional fractures of the femur are seen.    IMPRESSION: Fracture of the left femoral neck.      Thank you for this opportunity to contribute to the care of your patient.           Verified By: Dionne Ano WALL, M.D., MD    27-Mar-13 14:59, Pelvis AP Only   Pelvis AP Only   REASON FOR EXAM:    pain following trauma  COMMENTS:       PROCEDURE: DXR - DXR PELVIS AP ONLY  - Aug 06 2011  2:59PM     RESULT: An AP view of the bony pelvis shows a high intertrochanteric or   basocervical fracture of the proximal left femur. No additional fractures   of the bony pelvis are seen. There are noted degenerative changes of the   lower lumbar spine.    IMPRESSION:     1.  Fracture of the proximal left femur.  2.  No additional fractures of the bony pelvis are seen.  3.  There are degenerative changes of the lower lumbar spine.  Thank you for this opportunity to contribute to the care of your patient.           Verified By: Dionne Ano WALL, M.D., MD     Assessment/Admission Diagnosis Left intertrochanteric hip fracture    Plan The patient suffers from dementia.  I explained the diagnosis to the family.  I have recommended an ORIF of the left intertrochanteric hip fracture with a DHS.  The fracture doesn't extend below the lesser trochanter.  I discussed the details of surgery and the post-op course.  The risks and benefits of surgical intervention were discussed in detail with the patient's family  and they expressed understanding of the risks and benefits and agreed with plans for surgery.  The risks include, but are not limited to: infection, bleeding requiring transfusion, nerve and blood vessel injury, leg length discrepancy, change in leg rotation, malunion, nonunion, hardware failure, screw cutout and need for more surgery including conversion to a total hip arthroplasty, DVT, and PE, MI, stroke, pneumonia, respiratory failure, and death.  The patient was evaluated for pre-op clearance by primedoc and is cleared for surgery.  Plan for surgery tomorrow.  NPO after midnight. Continue Buck's traction overnight.   Electronic Signatures: Thornton Park (MD)  (Signed 27-Mar-13 22:45)  Authored: CHIEF COMPLAINT and HISTORY, ALLERGIES, HOME MEDICATIONS, FAMILY AND SOCIAL HISTORY, REVIEW  OF SYSTEMS, PHYSICAL EXAM, LABS, Radiology, ASSESSMENT AND PLAN   Last Updated: 27-Mar-13 22:45 by Thornton Park (MD)

## 2014-09-13 DIAGNOSIS — L930 Discoid lupus erythematosus: Secondary | ICD-10-CM | POA: Diagnosis not present

## 2014-09-13 DIAGNOSIS — R000 Tachycardia, unspecified: Secondary | ICD-10-CM | POA: Diagnosis not present

## 2014-09-13 DIAGNOSIS — G309 Alzheimer's disease, unspecified: Secondary | ICD-10-CM | POA: Diagnosis not present

## 2014-09-13 DIAGNOSIS — F323 Major depressive disorder, single episode, severe with psychotic features: Secondary | ICD-10-CM | POA: Diagnosis not present

## 2014-09-29 DIAGNOSIS — R2231 Localized swelling, mass and lump, right upper limb: Secondary | ICD-10-CM

## 2014-11-14 DIAGNOSIS — I872 Venous insufficiency (chronic) (peripheral): Secondary | ICD-10-CM

## 2015-01-30 DIAGNOSIS — F39 Unspecified mood [affective] disorder: Secondary | ICD-10-CM | POA: Diagnosis not present

## 2015-01-30 DIAGNOSIS — R000 Tachycardia, unspecified: Secondary | ICD-10-CM | POA: Diagnosis not present

## 2015-01-30 DIAGNOSIS — G309 Alzheimer's disease, unspecified: Secondary | ICD-10-CM | POA: Diagnosis not present

## 2015-01-30 DIAGNOSIS — L930 Discoid lupus erythematosus: Secondary | ICD-10-CM

## 2015-02-07 ENCOUNTER — Other Ambulatory Visit: Payer: Self-pay

## 2015-02-07 ENCOUNTER — Other Ambulatory Visit: Payer: Self-pay | Admitting: Emergency Medicine

## 2015-02-07 ENCOUNTER — Inpatient Hospital Stay
Admission: EM | Admit: 2015-02-07 | Discharge: 2015-02-08 | DRG: 871 | Disposition: A | Discharge status: Skilled Nursing Facility | Payer: Medicare Other | Attending: Internal Medicine | Admitting: Internal Medicine

## 2015-02-07 ENCOUNTER — Encounter: Payer: Self-pay | Admitting: Emergency Medicine

## 2015-02-07 ENCOUNTER — Telehealth: Payer: Self-pay

## 2015-02-07 ENCOUNTER — Emergency Department: Payer: Medicare Other

## 2015-02-07 DIAGNOSIS — M329 Systemic lupus erythematosus, unspecified: Secondary | ICD-10-CM | POA: Diagnosis present

## 2015-02-07 DIAGNOSIS — J9601 Acute respiratory failure with hypoxia: Secondary | ICD-10-CM | POA: Diagnosis present

## 2015-02-07 DIAGNOSIS — Z886 Allergy status to analgesic agent status: Secondary | ICD-10-CM

## 2015-02-07 DIAGNOSIS — Z66 Do not resuscitate: Secondary | ICD-10-CM | POA: Diagnosis present

## 2015-02-07 DIAGNOSIS — Z993 Dependence on wheelchair: Secondary | ICD-10-CM

## 2015-02-07 DIAGNOSIS — Z888 Allergy status to other drugs, medicaments and biological substances status: Secondary | ICD-10-CM | POA: Diagnosis not present

## 2015-02-07 DIAGNOSIS — J189 Pneumonia, unspecified organism: Secondary | ICD-10-CM | POA: Diagnosis present

## 2015-02-07 DIAGNOSIS — A419 Sepsis, unspecified organism: Principal | ICD-10-CM | POA: Diagnosis present

## 2015-02-07 DIAGNOSIS — F329 Major depressive disorder, single episode, unspecified: Secondary | ICD-10-CM | POA: Diagnosis present

## 2015-02-07 DIAGNOSIS — J984 Other disorders of lung: Secondary | ICD-10-CM | POA: Diagnosis present

## 2015-02-07 DIAGNOSIS — Z952 Presence of prosthetic heart valve: Secondary | ICD-10-CM | POA: Diagnosis not present

## 2015-02-07 DIAGNOSIS — B372 Candidiasis of skin and nail: Secondary | ICD-10-CM | POA: Diagnosis present

## 2015-02-07 DIAGNOSIS — N390 Urinary tract infection, site not specified: Secondary | ICD-10-CM

## 2015-02-07 DIAGNOSIS — R532 Functional quadriplegia: Secondary | ICD-10-CM | POA: Diagnosis present

## 2015-02-07 DIAGNOSIS — Y95 Nosocomial condition: Secondary | ICD-10-CM | POA: Diagnosis present

## 2015-02-07 DIAGNOSIS — Z79899 Other long term (current) drug therapy: Secondary | ICD-10-CM

## 2015-02-07 DIAGNOSIS — R1310 Dysphagia, unspecified: Secondary | ICD-10-CM | POA: Diagnosis present

## 2015-02-07 DIAGNOSIS — Z7982 Long term (current) use of aspirin: Secondary | ICD-10-CM | POA: Diagnosis not present

## 2015-02-07 DIAGNOSIS — R0902 Hypoxemia: Secondary | ICD-10-CM

## 2015-02-07 DIAGNOSIS — F0390 Unspecified dementia without behavioral disturbance: Secondary | ICD-10-CM | POA: Diagnosis present

## 2015-02-07 HISTORY — DX: Tachycardia, unspecified: R00.0

## 2015-02-07 HISTORY — DX: Major depressive disorder, single episode, unspecified: F32.9

## 2015-02-07 HISTORY — DX: Unspecified dementia, unspecified severity, without behavioral disturbance, psychotic disturbance, mood disturbance, and anxiety: F03.90

## 2015-02-07 HISTORY — DX: Constipation, unspecified: K59.00

## 2015-02-07 HISTORY — DX: Systemic lupus erythematosus, unspecified: M32.9

## 2015-02-07 HISTORY — DX: Depression, unspecified: F32.A

## 2015-02-07 HISTORY — DX: Calculus of kidney: N20.0

## 2015-02-07 LAB — CBC WITH DIFFERENTIAL/PLATELET
BASOS ABS: 0 10*3/uL (ref 0–0.1)
BASOS PCT: 0 %
Eosinophils Absolute: 0 10*3/uL (ref 0–0.7)
Eosinophils Relative: 0 %
HEMATOCRIT: 44.2 % (ref 35.0–47.0)
Hemoglobin: 14.6 g/dL (ref 12.0–16.0)
LYMPHS PCT: 10 %
Lymphs Abs: 1.2 10*3/uL (ref 1.0–3.6)
MCH: 31.6 pg (ref 26.0–34.0)
MCHC: 33 g/dL (ref 32.0–36.0)
MCV: 95.7 fL (ref 80.0–100.0)
MONO ABS: 0.8 10*3/uL (ref 0.2–0.9)
Monocytes Relative: 7 %
NEUTROS ABS: 9.7 10*3/uL — AB (ref 1.4–6.5)
Neutrophils Relative %: 83 %
PLATELETS: 153 10*3/uL (ref 150–440)
RBC: 4.62 MIL/uL (ref 3.80–5.20)
RDW: 13.8 % (ref 11.5–14.5)
WBC: 11.8 10*3/uL — AB (ref 3.6–11.0)

## 2015-02-07 LAB — URINALYSIS COMPLETE WITH MICROSCOPIC (ARMC ONLY)
Bilirubin Urine: NEGATIVE
GLUCOSE, UA: NEGATIVE mg/dL
Ketones, ur: NEGATIVE mg/dL
NITRITE: NEGATIVE
PH: 7 (ref 5.0–8.0)
PROTEIN: 100 mg/dL — AB
SPECIFIC GRAVITY, URINE: 1.012 (ref 1.005–1.030)

## 2015-02-07 LAB — COMPREHENSIVE METABOLIC PANEL
ALBUMIN: 3.6 g/dL (ref 3.5–5.0)
ALK PHOS: 62 U/L (ref 38–126)
ALT: 12 U/L — ABNORMAL LOW (ref 14–54)
ANION GAP: 11 (ref 5–15)
AST: 34 U/L (ref 15–41)
BILIRUBIN TOTAL: 1 mg/dL (ref 0.3–1.2)
BUN: 19 mg/dL (ref 6–20)
CALCIUM: 9 mg/dL (ref 8.9–10.3)
CO2: 28 mmol/L (ref 22–32)
Chloride: 100 mmol/L — ABNORMAL LOW (ref 101–111)
Creatinine, Ser: 1.21 mg/dL — ABNORMAL HIGH (ref 0.44–1.00)
GFR, EST AFRICAN AMERICAN: 45 mL/min — AB (ref >60)
GFR, EST NON AFRICAN AMERICAN: 38 mL/min — AB (ref >60)
GLUCOSE: 144 mg/dL — AB (ref 65–99)
POTASSIUM: 4.3 mmol/L (ref 3.5–5.1)
Sodium: 139 mmol/L (ref 135–145)
TOTAL PROTEIN: 7.3 g/dL (ref 6.5–8.1)

## 2015-02-07 LAB — BLOOD GAS, ARTERIAL
ACID-BASE EXCESS: 4.1 mmol/L — AB (ref 0.0–3.0)
Allens test (pass/fail): POSITIVE — AB
BICARBONATE: 28.5 meq/L — AB (ref 21.0–28.0)
FIO2: 0.28
O2 SAT: 96.5 %
PATIENT TEMPERATURE: 37
PO2 ART: 82 mmHg — AB (ref 83.0–108.0)
pCO2 arterial: 41 mmHg (ref 32.0–48.0)
pH, Arterial: 7.45 (ref 7.350–7.450)

## 2015-02-07 LAB — PROTIME-INR
INR: 1.03
Prothrombin Time: 13.7 seconds (ref 11.4–15.0)

## 2015-02-07 LAB — LIPASE, BLOOD: LIPASE: 20 U/L — AB (ref 22–51)

## 2015-02-07 LAB — HEMOGLOBIN A1C: Hgb A1c MFr Bld: 5 % (ref 4.0–6.0)

## 2015-02-07 LAB — TSH: TSH: 1.583 u[IU]/mL (ref 0.350–4.500)

## 2015-02-07 LAB — LACTIC ACID, PLASMA
LACTIC ACID, VENOUS: 1.9 mmol/L (ref 0.5–2.0)
Lactic Acid, Venous: 2.6 mmol/L (ref 0.5–2.0)

## 2015-02-07 LAB — TROPONIN I: TROPONIN I: 0.05 ng/mL — AB (ref <0.031)

## 2015-02-07 LAB — BRAIN NATRIURETIC PEPTIDE: B Natriuretic Peptide: 368 pg/mL — ABNORMAL HIGH (ref 0.0–100.0)

## 2015-02-07 MED ORDER — PIPERACILLIN-TAZOBACTAM 3.375 G IVPB 30 MIN
3.3750 g | Freq: Three times a day (TID) | INTRAVENOUS | Status: DC
  Administered 2015-02-07: 3.375 g via INTRAVENOUS
  Filled 2015-02-07: qty 50

## 2015-02-07 MED ORDER — PROMETHAZINE HCL 25 MG RE SUPP: 25.0000 mg | RECTAL | Status: DC | PRN

## 2015-02-07 MED ORDER — ONDANSETRON HCL 4 MG/2ML IJ SOLN: 4.0000 mg | Freq: Four times a day (QID) | INTRAMUSCULAR | Status: DC | PRN

## 2015-02-07 MED ORDER — DEXTROSE-NACL 5-0.9 % IV SOLN
INTRAVENOUS | Status: DC
  Administered 2015-02-07 – 2015-02-08 (×3): via INTRAVENOUS

## 2015-02-07 MED ORDER — PIPERACILLIN-TAZOBACTAM 3.375 G IVPB 30 MIN: 3.3750 g | Freq: Once | INTRAVENOUS | Status: DC

## 2015-02-07 MED ORDER — VANCOMYCIN HCL IN DEXTROSE 1-5 GM/200ML-% IV SOLN
1000.0000 mg | Freq: Once | INTRAVENOUS | Status: AC
  Administered 2015-02-07: 1000 mg via INTRAVENOUS
  Filled 2015-02-07: qty 200

## 2015-02-07 MED ORDER — PRO-STAT SUGAR FREE PO LIQD
30.0000 mL | Freq: Two times a day (BID) | ORAL | Status: DC
  Administered 2015-02-08: 12:00:00 30 mL via ORAL

## 2015-02-07 MED ORDER — SODIUM CHLORIDE 0.9 % IV BOLUS (SEPSIS)
30.0000 mL/kg | Freq: Once | INTRAVENOUS | Status: AC
  Administered 2015-02-07: 2253 mL via INTRAVENOUS

## 2015-02-07 MED ORDER — PIPERACILLIN-TAZOBACTAM 3.375 G IVPB
3.3750 g | Freq: Three times a day (TID) | INTRAVENOUS | Status: DC
  Administered 2015-02-07 – 2015-02-08 (×4): 3.375 g via INTRAVENOUS
  Filled 2015-02-07 (×7): qty 50

## 2015-02-07 MED ORDER — NYSTATIN 100000 UNIT/GM EX POWD
Freq: Two times a day (BID) | CUTANEOUS | Status: DC
  Administered 2015-02-07 – 2015-02-08 (×3): via TOPICAL
  Filled 2015-02-07: qty 15

## 2015-02-07 MED ORDER — ASPIRIN EC 81 MG PO TBEC
81.0000 mg | DELAYED_RELEASE_TABLET | Freq: Every day | ORAL | Status: DC
  Administered 2015-02-08: 81 mg via ORAL
  Filled 2015-02-07: qty 1

## 2015-02-07 MED ORDER — HEPARIN SODIUM (PORCINE) 5000 UNIT/ML IJ SOLN
5000.0000 [IU] | Freq: Three times a day (TID) | INTRAMUSCULAR | Status: DC
  Administered 2015-02-07 – 2015-02-08 (×5): 5000 [IU] via SUBCUTANEOUS
  Filled 2015-02-07 (×5): qty 1

## 2015-02-07 MED ORDER — ONDANSETRON HCL 4 MG PO TABS: 4.0000 mg | ORAL_TABLET | Freq: Four times a day (QID) | ORAL | Status: DC | PRN

## 2015-02-07 MED ORDER — ALUMINUM HYDROXIDE EX OINT: 1.0000 "application " | TOPICAL_OINTMENT | Freq: Two times a day (BID) | CUTANEOUS | Status: DC | PRN

## 2015-02-07 MED ORDER — PROSOURCE PO POWD: 1.0000 | Freq: Three times a day (TID) | ORAL | Status: DC

## 2015-02-07 MED ORDER — VANCOMYCIN HCL IN DEXTROSE 750-5 MG/150ML-% IV SOLN
750.0000 mg | INTRAVENOUS | Status: DC
  Administered 2015-02-07: 20:00:00 750 mg via INTRAVENOUS
  Filled 2015-02-07 (×2): qty 150

## 2015-02-07 MED ORDER — SODIUM CHLORIDE 0.9 % IV BOLUS (SEPSIS): 1000.0000 mL | INTRAVENOUS | Status: DC

## 2015-02-07 MED ORDER — ACETAMINOPHEN 325 MG PO TABS: 650.0000 mg | ORAL_TABLET | Freq: Four times a day (QID) | ORAL | Status: DC | PRN

## 2015-02-07 MED ORDER — SODIUM CHLORIDE 0.9 % IV BOLUS (SEPSIS): 30.0000 mL/kg | INTRAVENOUS | Status: DC

## 2015-02-07 MED ORDER — VITAMIN D (ERGOCALCIFEROL) 1.25 MG (50000 UNIT) PO CAPS
50000.0000 [IU] | ORAL_CAPSULE | ORAL | Status: DC
  Filled 2015-02-07: qty 1

## 2015-02-07 MED ORDER — ACETAMINOPHEN 650 MG RE SUPP: 650.0000 mg | Freq: Four times a day (QID) | RECTAL | Status: DC | PRN

## 2015-02-07 MED ORDER — POLYETHYLENE GLYCOL 3350 17 G PO PACK
17.0000 g | PACK | Freq: Every day | ORAL | Status: DC
  Administered 2015-02-08: 11:00:00 17 g via ORAL
  Filled 2015-02-07: qty 1

## 2015-02-07 NOTE — ED Notes (Signed)
Diamond MD at bedside.

## 2015-02-07 NOTE — Telephone Encounter (Signed)
PLEASE NOTE: All timestamps contained within this report are represented as [place] Standard Time. CONFIDENTIALTY NOTICE: This fax transmission is intended only for the addressee. It contains information that is legally privileged, confidential or otherwise protected from use or disclosure. If you are not the intended recipient, you are strictly prohibited from reviewing, disclosing, copying using or disseminating any of this information or taking any action in reliance on or regarding this information. If you have received this fax in error, please notify us immediately by telephone so that we can arrange for its return to [place]. Phone: [phone], Toll-Free: [phone], Fax: [phone] Page: 1 of 1 Call Id: [id number] [facility] Night - Client TELEPHONE ADVICE RECORD [facility] Medical Call Center Patient Name: Kathryn Higgins Gender: Female DOB: 11/26/1923 Age: 25 Y 7 M Return Phone Number: Address: City/State/Zip: [place] [facility] Night - Client Client Site [facility] [place] - Night Physician Till[place] Abide Contact Type Call Call Type Page Only Caller Name Christianne Dolin at [facility] CB# [phone] Relationship To Patient Provider Is this call to report lab results? No Return Phone Number Please choose phone number Initial Comment Caller states she needs to speak to the doctor on call. Nurse Assessment Guidelines Guideline Title Affirmed Question Affirmed Notes Nurse Date/Time (Eastern Time) Disp. Time Lamount Cohen Time) Disposition Final User 02/06/2015 9:57:38 PM Send to [facility] Paging Queue Dutch Quint 02/06/2015 10:03:29 PM Paged On Call to Other Provider Antonietta Breach 02/06/2015 10:03:47 PM Page Completed Yes Antonietta Breach After Care Instructions Given Call Event Type User Date / Time Description Paging DoctorName Phone DateTime Result/Outcome Message Type Notes Marga Melnick  [phone] 02/06/2015 10:03:29 PM Paged On Call to Other Provider Doctor Paged Marga Melnick 02/06/2015 10:03:36 PM Paged On Call to Another Provider Message Result

## 2015-02-07 NOTE — Clinical Social Work Note (Signed)
Clinical Social Work Assessment  Patient Details  Name: Kathryn Higgins MRN: [id number] Date of Birth: [birth year]  Date of referral:  02/07/15               Reason for consult:  Discharge Planning                Permission sought to share information with:  Facility Medical sales representative, Family Supports Permission granted to share information::  No, Yes, Verbal Permission Granted  Name::      (SNF rep)  Agency::   [facility] SN[facility] rep)  Relationship::   SONSHarvie Heck [phone] and Reggie [phone] joint HCPOA  Contact Information:     Housing/Transportation Living arrangements for the past 2 months:  Skilled Nursing Facility Source of Information:  Power of [name], Adult Children Patient Interpreter Needed:  None Criminal Activity/Legal Involvement Pertinent to Current Situation/Hospitalization:  No - Comment as needed Significant Relationships:  Adult Children, Community Support Lives with:  Facility Resident Do you feel safe going back to the place where you live?  Yes Need for family participation in patient care:  Yes (Comment)  Care giving concerns:  Patient admitted from [name] unit- she will return there at dc. Family interested in learning more about "whats to come" and CSW has asked MD to consider Palliative team consult for education, support and planning related to end of life care/decisions, etc,   Social Worker assessment / plan:   CSW has communicated with SNF rep who reports her bed will be available for her at time of dc.  Employment status:  Retired Health and safety inspector:  Medicare PT Recommendations:  Not assessed at this time Information / Referral to community resources:  Skilled Nursing Facility  Patient/Family's Response to care:  Family is supportive and realistic in regards to patient's memory care issues and decline. They are wanting to discuss EOL care and needs with Palliative team and CSW has made MD aware of this. Both sons share  HCPOA and report she has a Living Will.  Patient/Family's Understanding of and Emotional Response to Diagnosis, Current Treatment, and Prognosis:  Sons understand the progression of her memory impairment as well as her eating/swallow, etc.  They are realistic related to her disease and the progression of it.   Emotional Assessment Appearance:  Appears younger than stated age, Well-Groomed Attitude/Demeanor/Rapport:  Unable to Assess Affect (typically observed):  Unable to Assess Orientation:    Alcohol / Substance use:  Not Applicable Psych involvement (Current and /or in the community):  No (Comment)  Discharge Needs  Concerns to be addressed:  Other (Comment Required (Family interested in support and linking with Palliative Care) Readmission within the last 30 days:  No Current discharge risk:  None Barriers to Discharge:  No Barriers Identified   Liliana Cline, LCSW 02/07/2015, 10:55 AM

## 2015-02-07 NOTE — ED Notes (Signed)
Pt arrives via EMS from [facility].  Pt with cough, nausea/vomiting x1 day.  Pt given Tylenol and Phenergan PR prior to leaving facility.  Pt usually confused but alert at baseline.

## 2015-02-07 NOTE — Progress Notes (Signed)
Speech Therapy Note:  Consulted NSG, met with sons. Attempted to awaken pt with max verbal/tactile stim.  However, pt remains asleep.  Pt is unsafe for BSE at this time, will f/u tomorrow.

## 2015-02-07 NOTE — Progress Notes (Signed)
ANTIBIOTIC CONSULT NOTE - INITIAL  Pharmacy Consult for Vancomycin/Zosyn Indication: pneumonia  Allergies  Allergen Reactions  . Aricept [Donepezil Hcl] Other (See Comments)    Reaction: unknown  . Exelon [Rivastigmine Tartrate] Other (See Comments)    Reaction: unknown  . Ibuprofen Other (See Comments)    Reaction: unknown  . Inderal La [Propranolol Hcl] Other (See Comments)    Reaction: unknown    Patient Measurements: Height:  (157.5 cm) Weight: 165 lb 8 oz (75.07 kg) IBW/kg (Calculated) : 50.1 Adjusted Body Weight: 60.1 kg  Vital Signs: Temp: 99.6 F (37.6 C) (09/28 0406) Temp Source: Rectal (09/28 0406) BP: 126/77 mmHg (09/28 0515) Pulse Rate: 110 (09/28 0515) Intake/Output from previous day:   Intake/Output from this shift:    Labs:  Recent Labs  02/07/15 0408  WBC 11.8*  HGB 14.6  PLT 153  CREATININE 1.21*   Estimated Creatinine Clearance: 29.9 mL/min (by C-G formula based on Cr of 1.21). No results for input(s): VANCOTROUGH, VANCOPEAK, VANCORANDOM, GENTTROUGH, GENTPEAK, GENTRANDOM, TOBRATROUGH, TOBRAPEAK, TOBRARND, AMIKACINPEAK, AMIKACINTROU, AMIKACIN in the last 72 hours.   Microbiology: No results found for this or any previous visit (from the past 720 hour(s)).  Medical History: No past medical history on file.  Medications:  Infusions:  . piperacillin-tazobactam Stopped (02/07/15 0523)  . sodium chloride 2,253 mL (02/07/15 0448)  . vancomycin 1,000 mg (02/07/15 0457)   Assessment: 89 yof with cough/nausea/vomiting x 1 day, from [facility] Starting abx for pneumonia.  Vd 42 L, Ke 0.029 hr-1, T1/2 23.7 hr, predicted trough 17 mcg/mL  Goal of Therapy:  Vancomycin trough level 15-20 mcg/ml  Plan:  Expected duration 7 days with resolution of temperature and/or normalization of WBC. Zosyn 3.375 gm IV Q8H EI and vancomycin 750 mg IV Q24H with stacked dosing, second dose 16 hours after first. Will follow levels and adjust as needed to  maintain trough 15 to 20 mcg/mL  Carola Frost, Pharm.D. Clinical Pharmacist 02/07/2015,5:22 AM

## 2015-02-07 NOTE — Telephone Encounter (Signed)
Spoke with Jackquline Berlin at [facility] and pt was sent to [facility] ED on 02/07/15 at 3:30 AM; no update per [facility]. I spoke with [facility] and pt admitted to room 113.

## 2015-02-07 NOTE — Telephone Encounter (Signed)
Admitted with sepsis Will follow status

## 2015-02-07 NOTE — Progress Notes (Signed)
Speech Therapy Note: received order, consulted NSG and met w/ Son in room. Attempted to awaken pt w/ max. Verbal/tactile stim but w/out success. Pt remained lethargic and sleeping. Son reported pt had been eating more of a pureed diet w/ some oral holding noted in the past few days/weeks. He denied any overt s/s of aspiration during meals. Pt appeared to have experienced an episode of n/v per chart notes prior to admission. Will f/u w/ pt later today for attempt at BSE or f/u in AM when pt may be more awake/alert to safely participate in evaluation. Rec. Oral care for hygiene and stim in the meantime.

## 2015-02-07 NOTE — ED Provider Notes (Signed)
[facility] Emergency Department Provider Note  ____________________________________________  Time seen: Approximately 4:00 AM  I have reviewed the triage vital signs and the nursing notes.   HISTORY  Chief Complaint Cough  History limited secondary to decreased LOC  HPI Kathryn Higgins is a 81 y.o. female who presents to the ED via EMS from [facility] nursing facility with a chief complaint of fever, cough, nausea/vomiting 1 this evening. Nursing staff reports patient has baseline confusion but normally alert. This evening patient has decreased level of consciousness. Staff reports temperature of 101.26F. Son at bedside. Reports patient has a bovine valve and was recently taken off of one medicine to control her heart rate.   Past Medical History  Diagnosis Date  . Dementia   . Systemic lupus erythematosus   . Tachycardia   . Depressive disorder   . Constipation   . Kidney stones     Patient Active Problem List   Diagnosis Date Noted  . HCAP (healthcare-associated pneumonia) 02/07/2015    Past Surgical History  Procedure Laterality Date  . Aortic valve replacement  2004  . Cataract extraction  2000    Current Outpatient Rx  Name  Route  Sig  Dispense  Refill  . acetaminophen (TYLENOL) 650 MG suppository   Rectal   Place 650 mg rectally every 4 (four) hours as needed for fever.         Marland Kitchen aluminum hydroxide (DERMAGRAN) ointment   Topical   Apply 1 application topically every 12 (twelve) hours as needed (for redness). Apply to areas between thighs and buttocks         . aspirin EC 81 MG tablet   Oral   Take 81 mg by mouth daily.         . ergocalciferol (VITAMIN D2) 50000 UNITS capsule   Oral   Take 50,000 Units by mouth every 30 (thirty) days.         . Nutritional Supplements (PROSOURCE) POWD   Oral   Take 1 Package by mouth 3 (three) times daily.         . polyethylene glycol (MIRALAX / GLYCOLAX) packet   Oral   Take  17 g by mouth daily.         . promethazine (PHENERGAN) 25 MG suppository   Rectal   Place 25 mg rectally every 4 (four) hours as needed for nausea or vomiting.           Allergies Aricept; Exelon; Ibuprofen; and Inderal la  History reviewed. No pertinent family history.  Social History Social History  Substance Use Topics  . Smoking status: Unknown If Ever Smoked  . Smokeless tobacco: None  . Alcohol Use: None    Review of Systems Constitutional: Positive for fever/chills Eyes: No visual changes. ENT: No sore throat. Cardiovascular: Denies chest pain. Respiratory: Positive for cough and shortness of breath. Gastrointestinal: No abdominal pain.  Positive for single episode of nausea and vomiting.  No diarrhea.  No constipation. Genitourinary: Negative for dysuria. Musculoskeletal: Negative for back pain. Skin: Negative for rash. Neurological: Negative for headaches, focal weakness or numbness.  10-point ROS otherwise negative.  ____________________________________________   PHYSICAL EXAM:  VITAL SIGNS: ED Triage Vitals  Enc Vitals Group     BP --      Pulse --      Resp --      Temp --      Temp src --      SpO2 --  Weight --      Height --      Head Cir --      Peak Flow --      Pain Score --      Pain Loc --      Pain Edu? --      Excl. in GC? --     Constitutional: Unresponsive and in moderate acute distress. Arousable to painful stimuli. Eyes: Conjunctivae are normal. PERRL. EOMI. Head: Atraumatic. Nose: No congestion/rhinnorhea. Mouth/Throat: Mucous membranes are dry.  Oropharynx non-erythematous. Neck: No stridor.   Cardiovascular: Tachycardic, regular rhythm. Grossly normal heart sounds.  Good peripheral circulation. Respiratory: Increased respiratory effort.  No retractions. Lungs with rhonchi bilaterally. Loose, rattly cough noted. Gastrointestinal: Soft and nontender. No distention. No abdominal bruits. No CVA  tenderness. Musculoskeletal: No lower extremity tenderness nor edema.  No joint effusions. Neurologic: Groans to painful stimuli. Son states baseline status patient is more alert but remains confused. Skin:  Skin is warm, dry and intact. No rash noted. Psychiatric: Mood and affect are normal. Speech and behavior are normal.  ____________________________________________   LABS (all labs ordered are listed, but only abnormal results are displayed)  Labs Reviewed  COMPREHENSIVE METABOLIC PANEL - Abnormal; Notable for the following:    Chloride 100 (*)    Glucose, Bld 144 (*)    Creatinine, Ser 1.21 (*)    ALT 12 (*)    GFR calc non Af Amer 38 (*)    GFR calc Af Amer 45 (*)    All other components within normal limits  CBC WITH DIFFERENTIAL/PLATELET - Abnormal; Notable for the following:    WBC 11.8 (*)    Neutro Abs 9.7 (*)    All other components within normal limits  LACTIC ACID, PLASMA - Abnormal; Notable for the following:    Lactic Acid, Venous 2.6 (*)    All other components within normal limits  LIPASE, BLOOD - Abnormal; Notable for the following:    Lipase 20 (*)    All other components within normal limits  BRAIN NATRIURETIC PEPTIDE - Abnormal; Notable for the following:    B Natriuretic Peptide 368.0 (*)    All other components within normal limits  TROPONIN I - Abnormal; Notable for the following:    Troponin I 0.05 (*)    All other components within normal limits  BLOOD GAS, ARTERIAL - Abnormal; Notable for the following:    pO2, Arterial 82 (*)    Bicarbonate 28.5 (*)    Acid-Base Excess 4.1 (*)    Allens test (pass/fail) POSITIVE (*)    All other components within normal limits  URINALYSIS COMPLETEWITH MICROSCOPIC ([facility] ONLY) - Abnormal; Notable for the following:    Color, Urine YELLOW (*)    APPearance TURBID (*)    Hgb urine dipstick 2+ (*)    Protein, ur 100 (*)    Leukocytes, UA 3+ (*)    Bacteria, UA MANY (*)    Squamous Epithelial / LPF 0-5 (*)     All other components within normal limits  CULTURE, BLOOD (ROUTINE X 2)  CULTURE, BLOOD (ROUTINE X 2)  URINE CULTURE  PROTIME-INR  LACTIC ACID, PLASMA  TYPE AND SCREEN   ____________________________________________  EKG  ED ECG REPORT I, SUNG,JADE J, the attending physician, personally viewed and interpreted this ECG.   Date: 02/07/2015  EKG Time: 0439  Rate: 105  Rhythm: sinus tachycardia  Axis: Normal  Intervals:none  ST&T Change: Nonspecific  ____________________________________________  RADIOLOGY  Portable  chest x-ray (viewed by me, interpreted per Dr. Grace Isaac):  1. Patchy bilateral lung opacity, primarily concerning for pneumonia. 2. Hypoventilation and possible background chronic lung disease. 3. Cardiomegaly. Aortic valve replacement. __________________________________________   PROCEDURES  Procedure(s) performed: None  Critical Care performed:   CRITICAL CARE Performed by: Irean Hong   Total critical care time: 30 minutes Critical care time was exclusive of separately billable procedures and treating other patients.  Critical care was necessary to treat or prevent imminent or life-threatening deterioration.  Critical care was time spent personally by me on the following activities: development of treatment plan with patient and/or surrogate as well as nursing, discussions with consultants, evaluation of patient's response to treatment, examination of patient, obtaining history from patient or surrogate, ordering and performing treatments and interventions, ordering and review of laboratory studies, ordering and review of radiographic studies, pulse oximetry and re-evaluation of patient's condition.  ____________________________________________   INITIAL IMPRESSION / ASSESSMENT AND PLAN / ED COURSE  Pertinent labs & imaging results that were available during my care of the patient were reviewed by me and considered in my medical decision making (see  chart for details).  81 year old female, DNR, from NH presents for fever, cough, decreased LOC. Code sepsis called upon patient's arrival to the ED. Initiate IV fluid resuscitation, sepsis protocol lab work, chest x-ray, urinalysis and reassess.  ----------------------------------------- 5:31 AM on 02/07/2015 -----------------------------------------  Patient remains hemodynamically stable. 97% on 2 L nasal cannula. She is less tachycardic. Discussed case with hospitalist who will evaluate patient in the emergency department for admission. ____________________________________________   FINAL CLINICAL IMPRESSION(S) / ED DIAGNOSES  Final diagnoses:  Sepsis, due to unspecified organism  Bilateral pneumonia  HCAP (healthcare-associated pneumonia)  Hypoxia      Irean Hong, MD 02/07/15 [phone]

## 2015-02-07 NOTE — Progress Notes (Signed)
[facility] Physicians - [place] at [facility]   PATIENT NAME: Kathryn Higgins    MR#:  [id number]  DATE OF BIRTH:  12-27-1923  SUBJECTIVE:  CHIEF COMPLAINT:   Chief Complaint  Patient presents with  . Cough   - Pleasantly confused patient, lying in bed. Not in any acute distress. -Hypoxic on presentation, not on oxygen at home. Currently on 3 L here. - son Kathryn Higgins at bedside. Swallow eval pending today  REVIEW OF SYSTEMS:  Review of Systems  Unable to perform ROS: dementia     DRUG ALLERGIES:   Allergies  Allergen Reactions  . Aricept [Donepezil Hcl] Other (See Comments)    Reaction: unknown  . Exelon [Rivastigmine Tartrate] Other (See Comments)    Reaction: unknown  . Ibuprofen Other (See Comments)    Reaction: unknown  . Inderal La [Propranolol Hcl] Other (See Comments)    Reaction: unknown    VITALS:  Blood pressure 119/66, pulse 71, temperature 98.6 F (37 C), temperature source Oral, resp. rate 18, height  (1.575 m), weight 74.163 kg (163 lb 8 oz), SpO2 98 %.  PHYSICAL EXAMINATION:  Physical Exam  GENERAL:  81 y.o.-year-old patient lying in the bed with no acute distress.  EYES: Pupils equal, round, reactive to light and accommodation. No scleral icterus. Extraocular muscles intact.  HEENT: Head atraumatic, normocephalic. Oropharynx and nasopharynx clear.  NECK:  Supple, no jugular venous distention. No thyroid enlargement, no tenderness.  LUNGS: Normal breath sounds bilaterally, no wheezing, rales,rhonchi or crepitation. No use of accessory muscles of respiration. Decreased bibasilar breath sounds CARDIOVASCULAR: S1, S2 normal. No  rubs, or gallops. 3/6 systolic murmur in the aortic area ABDOMEN: Soft, nontender, nondistended. Bowel sounds present. No organomegaly or mass.  EXTREMITIES: No pedal edema, cyanosis, or clubbing.  NEUROLOGIC: Cranial nerves II through XII are intact. No focal motor or sensory deficits. Gait not checked.   PSYCHIATRIC: The patient is alert not oriented at all  SKIN: No obvious rash, lesion, or ulcer.    LABORATORY PANEL:   CBC  Recent Labs Lab 02/07/15 0408  WBC 11.8*  HGB 14.6  HCT 44.2  PLT 153   ------------------------------------------------------------------------------------------------------------------  Chemistries   Recent Labs Lab 02/07/15 0408  NA 139  K 4.3  CL 100*  CO2 28  GLUCOSE 144*  BUN 19  CREATININE 1.21*  CALCIUM 9.0  AST 34  ALT 12*  ALKPHOS 62  BILITOT 1.0   ------------------------------------------------------------------------------------------------------------------  Cardiac Enzymes  Recent Labs Lab 02/07/15 0408  TROPONINI 0.05*   ------------------------------------------------------------------------------------------------------------------  RADIOLOGY:  Dg Chest Port 1 View  02/07/2015   CLINICAL DATA:  Cough and shortness of breath.  EXAM: PORTABLE CHEST 1 VIEW  COMPARISON:  None.  FINDINGS: Cardiomegaly and mild aortic tortuosity. The patient is status post aortic valve replacement.  Low lung volumes with indistinct bilateral lung opacities, with greatest density medially and inferiorly on the right.  Remote proximal left humerus fracture.  IMPRESSION: 1. Patchy bilateral lung opacity, primarily concerning for pneumonia. 2. Hypoventilation and possible background chronic lung disease. 3. Cardiomegaly.  Aortic valve replacement.   Electronically Signed   By: Marnee Spring M.D.   On: 02/07/2015 04:35    EKG:   Orders placed or performed during the hospital encounter of 02/07/15  . ED EKG 12-Lead  . ED EKG 12-Lead    ASSESSMENT AND PLAN:   81 y/o F with past medical history significant for severe dementia, depression, nephrolithiasis, lupus from [facility] long-term facility admitted  for possible pneumonia.  #1 sepsis-with fever and increased white count. -Blood cultures have been ordered -Aspiration cannot be ruled  out. Swallow eval pending today. -Chest x-ray with bilateral infiltrates. -Increased oxygen requirements-currently on 3 L oxygen. -Continue Vanco and Zosyn for now.  #2 severe dementia-pleasantly confused and seems to be at baseline at this time. -Patient is wheelchair bound at baseline. Answers to simple questions, speech is not understandable. This is her baseline according to son.  #3 Yeast infection of the skin folds-started nystatin  #4 DVT prophylaxis-on subcutaneous heparin   All the records are reviewed and case discussed with Care Management/Social Workerr. Management plans discussed with the patient, family and they are in agreement.  CODE STATUS: DO NOT RESUSCITATE  TOTAL TIME TAKING CARE OF THIS PATIENT: 36 minutes.   POSSIBLE D/C IN 1-2 DAYS, DEPENDING ON CLINICAL CONDITION.   Kathryn Higgins M.D on 02/07/2015 at 12:28 PM  Between 7am to 6pm - Pager - [phone]  After 6pm go to [email or web address] - password [email or web address] [facility]  [place] [facility]  Office  [phone]  CC: Primary care physician; No primary care Celise Bazar on file.

## 2015-02-07 NOTE — Plan of Care (Signed)
Problem: Discharge Progression Outcomes Goal: Other Discharge Outcomes/Goals Outcome: Progressing Plan of Care Progress to Goal: Patient is confused, no indications of pain. VSS. Patient is NPO until speech can evaluate. Patient receiving IV fluids.

## 2015-02-08 LAB — BASIC METABOLIC PANEL
ANION GAP: 3 — AB (ref 5–15)
BUN: 10 mg/dL (ref 6–20)
CALCIUM: 8 mg/dL — AB (ref 8.9–10.3)
CO2: 27 mmol/L (ref 22–32)
Chloride: 111 mmol/L (ref 101–111)
Creatinine, Ser: 0.82 mg/dL (ref 0.44–1.00)
Glucose, Bld: 94 mg/dL (ref 65–99)
Potassium: 3.1 mmol/L — ABNORMAL LOW (ref 3.5–5.1)
SODIUM: 141 mmol/L (ref 135–145)

## 2015-02-08 MED ORDER — NYSTATIN 100000 UNIT/GM EX POWD: 1.0000 g | Freq: Two times a day (BID) | CUTANEOUS | Status: AC

## 2015-02-08 MED ORDER — LEVOFLOXACIN 750 MG PO TABS: 750.0000 mg | ORAL_TABLET | Freq: Every day | ORAL | Status: AC

## 2015-02-08 MED ORDER — PRO-STAT SUGAR FREE PO LIQD: 30.0000 mL | Freq: Two times a day (BID) | ORAL | Status: AC

## 2015-02-08 NOTE — Progress Notes (Addendum)
Speech Language Pathology Dysphagia Evaluation: Dysphagia  Patient Details Name: Kathryn Higgins MRN: [id number] DOB: 02-11-1924 Today's Date: 02/08/2015 Time: 9562-1308 SLP Time Calculation (min) (ACUTE ONLY): 45 min  Assessment / Plan / Recommendation Clinical Impression  Pt appeared to safely tolerate trials of thin liquids(via cup and straw-6 ozs+) and purees(4 ozs) w/ no overt s/s of aspiration noted; no change or decline in vocal quality or respiratory status. Pt exhibited min. Slower oral phase w/ increased texture(purees) but no significant oral holding noted and pt cleared appropriately b/t trials - often alternated bite of food w/ sip of liquid. Pt required full assistance w/ feeding d/t Cognitive decline; moderate verbal cues given. Pt appears at reduced risk for aspiration w/ a modified diet and following aspiration precautions; reduce distractions during meals. Rec. Dietician f/u for supplement(drink).    HPI Other Pertinent Information: pt was lethargic yesterday but it awake/alert today; confused but able to be redirected to participate in taking po's w/ SLP. Talkative; distracted.  Pt is an 81 y/o F with past medical history significant for severe Dementia, depression, nephrolithiasis, lupus from [facility] long-term facility admitted for possible pneumonia after an episode of N/V. Sons reported pt has been exhibiting oral phase dysphagia c/b holding and pocketing foods; diet has been modified to a pureed diet at the NH. They report she drinks a moderate amount w/out deficits noted. She is somewhat verbal at baseline but does not follow instructions; needs moderate cues.     Pertinent Vitals Pain Assessment: No/denies pain  SLP Plan  Continue with current plan of care    Recommendations Diet recommendations: Dysphagia 1 (puree);Thin liquid Liquids provided via: Cup;Straw Medication Administration: Crushed with puree Supervision: Staff to assist with self feeding;Full  supervision/cueing for compensatory strategies Compensations: Minimize environmental distractions;Small sips/bites;Slow rate;Check for pocketing;Follow solids with liquid Postural Changes and/or Swallow Maneuvers: Seated upright 90 degrees              General recommendations:  (Dietician f/u for drink supplement for nutritional support) Oral Care Recommendations: Oral care BID;Staff/trained caregiver to provide oral care Follow up Recommendations: Skilled Nursing facility Plan: Continue with current plan of care    GO    Kathryn Som, MS, CCC-SLP  Kathryn Higgins,Kathryn Higgins 02/08/2015, 2:51 PM

## 2015-02-08 NOTE — Discharge Summary (Signed)
[facility] Physicians - [place] at [facility]   PATIENT NAME: Kathryn Higgins    MR#:  [id number]  DATE OF BIRTH:  November 17, 1923  DATE OF ADMISSION:  02/07/2015 ADMITTING PHYSICIAN: Arnaldo Natal, MD  DATE OF DISCHARGE: 02/08/2015  PRIMARY CARE PHYSICIAN: Tillman Abide, MD    ADMISSION DIAGNOSIS:  UTI (lower urinary tract infection) [N39.0] Hypoxia [R09.02] Bilateral pneumonia [J18.9] HCAP (healthcare-associated pneumonia) [J18.9] Sepsis, due to unspecified organism [A41.9]  DISCHARGE DIAGNOSIS:  Active Problems:   HCAP (healthcare-associated pneumonia)   SECONDARY DIAGNOSIS:   Past Medical History  Diagnosis Date  . Dementia   . Systemic lupus erythematosus   . Tachycardia   . Depressive disorder   . Constipation   . Kidney stones     HOSPITAL COURSE:  81 y/o F with past medical history significant for severe dementia, depression, nephrolithiasis, lupus from [facility] long-term facility admitted for pneumonia.  1 sepsis-with fever and increased white count. Blood cultures are negative to date. She doesn ot have signs of overt aspiration as per speech consultation. Her pneumonia is from HCAP. She was on [facility] and ZOSYN and will be discharged on PO Levaquin.  2. Acute hypoxic resp failuree from HCAP: She was on o2 while on the hospital. Her symptoms have improved. She was 96% on room air  3 severe dementia-pleasantly confused and seems to be at baseline at this time. Patient is wheelchair bound at baseline due to functional quadreplegia. Answers to simple questions, speech is not understandable. This is her baseline according to son. Continue pureed diet with aspiration precautions.   4 Yeast infection of the skin folds-started nystatin   DISCHARGE CONDITIONS AND DIET:  Fair condition to SNF Pureed diet with aspiration precautions and encourage nutritional supplementation  CONSULTS OBTAINED:     DRUG ALLERGIES:   Allergies  Allergen  Reactions  . Aricept [Donepezil Hcl] Other (See Comments)    Reaction: unknown  . Exelon [Rivastigmine Tartrate] Other (See Comments)    Reaction: unknown  . Ibuprofen Other (See Comments)    Reaction: unknown  . Inderal La [Propranolol Hcl] Other (See Comments)    Reaction: unknown    DISCHARGE MEDICATIONS:   Current Discharge Medication List    START taking these medications   Details  Amino Acids-Protein Hydrolys (FEEDING SUPPLEMENT, PRO-STAT SUGAR FREE 64,) LIQD Take 30 mLs by mouth 2 (two) times daily. Qty: 900 mL, Refills: 0    levofloxacin (LEVAQUIN) 750 MG tablet Take 1 tablet (750 mg total) by mouth daily. Qty: 5 tablet, Refills: 0    nystatin (MYCOSTATIN/NYSTOP) 100000 UNIT/GM POWD Apply 1 g topically 2 (two) times daily. Qty: 1 Bottle, Refills: 0      CONTINUE these medications which have NOT CHANGED   Details  acetaminophen (TYLENOL) 650 MG suppository Place 650 mg rectally every 4 (four) hours as needed for fever.    aluminum hydroxide (DERMAGRAN) ointment Apply 1 application topically every 12 (twelve) hours as needed (for redness). Apply to areas between thighs and buttocks    aspirin EC 81 MG tablet Take 81 mg by mouth daily.    ergocalciferol (VITAMIN D2) 50000 UNITS capsule Take 50,000 Units by mouth every 30 (thirty) days.    Nutritional Supplements (PROSOURCE) POWD Take 1 Package by mouth 3 (three) times daily.    polyethylene glycol (MIRALAX / GLYCOLAX) packet Take 17 g by mouth daily.    promethazine (PHENERGAN) 25 MG suppository Place 25 mg rectally every 4 (four) hours as needed for nausea  or vomiting.              Today   CHIEF COMPLAINT:  Patient is pleasantly demented   VITAL SIGNS:  Blood pressure 114/97, pulse 82, temperature 98.1 F (36.7 C), temperature source Oral, resp. rate 18, height  (1.575 m), weight 74.163 kg (163 lb 8 oz), SpO2 93 %.   REVIEW OF SYSTEMS:  Review of Systems  Unable to perform  ROS    PHYSICAL EXAMINATION:  GENERAL:  81 y.o.-year-old patient lying in the bed with no acute distress.  NECK:  Supple, no jugular venous distention. No thyroid enlargement, no tenderness.  LUNGS: Normal breath sounds bilaterally, no wheezing, rales,rhonchi  No use of accessory muscles of respiration.  CARDIOVASCULAR: S1, S2 normal. No murmurs, rubs, or gallops.  ABDOMEN: Soft, non-tender, non-distended. Bowel sounds present. No organomegaly or mass.  EXTREMITIES: No pedal edema, cyanosis, or clubbing.  PSYCHIATRIC: The patient is alert and oriented x 3.  SKIN: No obvious rash, lesion, or ulcer.   DATA REVIEW:   CBC  Recent Labs Lab 02/07/15 0408  WBC 11.8*  HGB 14.6  HCT 44.2  PLT 153    Chemistries   Recent Labs Lab 02/07/15 0408 02/08/15 0435  NA 139 141  K 4.3 3.1*  CL 100* 111  CO2 28 27  GLUCOSE 144* 94  BUN 19 10  CREATININE 1.21* 0.82  CALCIUM 9.0 8.0*  AST 34  --   ALT 12*  --   ALKPHOS 62  --   BILITOT 1.0  --     Cardiac Enzymes  Recent Labs Lab 02/07/15 0408  TROPONINI 0.05*    Microbiology Results  @  RADIOLOGY:  Dg Chest Port 1 View  02/07/2015   CLINICAL DATA:  Cough and shortness of breath.  EXAM: PORTABLE CHEST 1 VIEW  COMPARISON:  None.  FINDINGS: Cardiomegaly and mild aortic tortuosity. The patient is status post aortic valve replacement.  Low lung volumes with indistinct bilateral lung opacities, with greatest density medially and inferiorly on the right.  Remote proximal left humerus fracture.  IMPRESSION: 1. Patchy bilateral lung opacity, primarily concerning for pneumonia. 2. Hypoventilation and possible background chronic lung disease. 3. Cardiomegaly.  Aortic valve replacement.   Electronically Signed   By: Marnee Spring M.D.   On: 02/07/2015 04:35      Management plans discussed with the CSW Stable for discharge SNF  Patient should follow up with PCP in 1 week  CODE STATUS:     Code Status Orders         Start     Ordered   02/07/15 0745  Do not attempt resuscitation (DNR)   Continuous    Question Answer Comment  In the event of cardiac or respiratory ARREST Do not call a "code blue"   In the event of cardiac or respiratory ARREST Do not perform Intubation, CPR, defibrillation or ACLS   In the event of cardiac or respiratory ARREST Use medication by any route, position, wound care, and other measures to relive pain and suffering. May use oxygen, suction and manual treatment of airway obstruction as needed for comfort.      02/07/15 0744      TOTAL TIME TAKING CARE OF THIS PATIENT: 35 minutes.    MODY, SITAL M.D on 02/08/2015 at 11:19 AM  Between 7am to 6pm - Pager - [phone] After 6pm go to [email or web address] - password [email or web address] [facility]  [place] [facility]  Office  [phone]  CC: Primary  care physician; Tillman Abide, MD

## 2015-02-08 NOTE — Progress Notes (Signed)
Patient for d/c today to SNF bed at  [facility] as pta. Family (son [name])  and agreeable to this plan- plan transfer via EMS. Reece Levy, MSW, Theresia Majors [phone]

## 2015-02-08 NOTE — Progress Notes (Signed)
Pt VSS, No complaints of pain nor nausea; Tolerating diet, Pt received discharge orders. Instructions were called to [facility] and report was given to [employer]. EMS notified and awaiting transport.

## 2015-02-08 NOTE — Care Management (Signed)
Admitted to this facility with the diagnosis of HealthCare Associated pneumonia. A resident of [place] since 05/17/10. Resides on the long term unit. Son is Reggie [phone] or [phone]), Sees Dr. Alphonsus Sias at [facility]. Chest x-ray reveal patchy bilateral lung capacity, pneumonia.  NPO scheduled for speech evaluation. WBC's 11.8 yesterday. IV Vancomycin and Zosyn continues. Gwenette Greet RN MSN Care Management [phone]

## 2015-02-09 ENCOUNTER — Encounter: Payer: Self-pay | Admitting: Internal Medicine

## 2015-02-09 LAB — URINE CULTURE

## 2015-02-09 NOTE — H&P (Signed)
Kathryn Higgins is an 81 y.o. female.   Chief Complaint: Hypoxia HPI: Patient sent to the ED from nursing home due to hypoxia.  She has developed a wet cough over the last day and was found to be hypoxic this evening during routine vitals.  PMH significant for dementia and dysphagia.  The staff feeds her via spoon but her sons report that she has been found to pocket food and recently underwent speech therapy for dysphagia.  CXR in the emergency department revealed pneumonia which prompted the ED physician to call for admission.  Past Medical History  Diagnosis Date  . Dementia   . Systemic lupus erythematosus   . Tachycardia   . Depressive disorder   . Constipation   . Kidney stones     Past Surgical History  Procedure Laterality Date  . Aortic valve replacement  2004  . Cataract extraction  2000    Family History  Problem Relation Age of Onset  . CAD      Social History:  reports that she has never smoked. She does not have any smokeless tobacco history on file. She reports that she does not drink alcohol or use illicit drugs.  Allergies:  Allergies  Allergen Reactions  . Aricept [Donepezil Hcl] Other (See Comments)    Reaction: unknown  . Exelon [Rivastigmine Tartrate] Other (See Comments)    Reaction: unknown  . Ibuprofen Other (See Comments)    Reaction: unknown  . Inderal La [Propranolol Hcl] Other (See Comments)    Reaction: unknown    Prior to Admission medications   Medication Sig Start Date End Date Taking? Authorizing Provider  acetaminophen (TYLENOL) 650 MG suppository Place 650 mg rectally every 4 (four) hours as needed for fever.   Yes Historical Provider, MD  aluminum hydroxide [facility]) ointment Apply 1 application topically every 12 (twelve) hours as needed (for redness). Apply to areas between thighs and buttocks   Yes Historical Provider, MD  aspirin EC 81 MG tablet Take 81 mg by mouth daily.   Yes Historical Provider, MD  ergocalciferol (VITAMIN D2)  50000 UNITS capsule Take 50,000 Units by mouth every 30 (thirty) days.   Yes Historical Provider, MD  Nutritional Supplements (PROSOURCE) POWD Take 1 Package by mouth 3 (three) times daily.   Yes Historical Provider, MD  polyethylene glycol (MIRALAX / GLYCOLAX) packet Take 17 g by mouth daily.   Yes Historical Provider, MD  promethazine (PHENERGAN) 25 MG suppository Place 25 mg rectally every 4 (four) hours as needed for nausea or vomiting.   Yes Historical Provider, MD  Amino Acids-Protein Hydrolys (FEEDING SUPPLEMENT, PRO-STAT SUGAR FREE 64,) LIQD Take 30 mLs by mouth 2 (two) times daily. 02/08/15   Bettey Costa, MD  levofloxacin (LEVAQUIN) 750 MG tablet Take 1 tablet (750 mg total) by mouth daily. 02/08/15   Bettey Costa, MD  nystatin (MYCOSTATIN/NYSTOP) 100000 UNIT/GM POWD Apply 1 g topically 2 (two) times daily. 02/08/15   Bettey Costa, MD     Results for orders placed or performed during the hospital encounter of 02/07/15 (from the past 48 hour(s))  Basic metabolic panel     Status: Abnormal   Collection Time: 02/08/15  4:35 AM  Result Value Ref Range   Sodium 141 135 - 145 mmol/L   Potassium 3.1 (L) 3.5 - 5.1 mmol/L   Chloride 111 101 - 111 mmol/L   CO2 27 22 - 32 mmol/L   Glucose, Bld 94 65 - 99 mg/dL   BUN 10 6 - 20  mg/dL   Creatinine, Ser 0.82 0.44 - 1.00 mg/dL   Calcium 8.0 (L) 8.9 - 10.3 mg/dL   GFR calc non Af Amer >60 >60 mL/min   GFR calc Af Amer >60 >60 mL/min    Comment: (NOTE) The eGFR has been calculated using the CKD EPI equation. This calculation has not been validated in all clinical situations. eGFR's persistently <60 mL/min signify possible Chronic Kidney Disease.    Anion gap 3 (L) 5 - 15   No results found.  Review of Systems  Unable to perform ROS: dementia    Blood pressure 123/71, pulse 87, temperature 98.8 F (37.1 C), temperature source Oral, resp. rate 20, height 5' 2"  (1.575 m), weight 74.163 kg (163 lb 8 oz), SpO2 96 %. Physical Exam  Vitals  reviewed. Constitutional: She appears well-developed and well-nourished.  HENT:  Head: Normocephalic and atraumatic.  Mouth/Throat: Oropharynx is clear and moist.  Eyes: Conjunctivae and EOM are normal. Pupils are equal, round, and reactive to light. No scleral icterus.  Neck: Normal range of motion. Neck supple. No JVD present. No tracheal deviation present. No thyromegaly present.  Cardiovascular: Normal rate, regular rhythm and normal heart sounds.  Exam reveals no gallop and no friction rub.   No murmur heard. Respiratory: Effort normal. She has rales.  Supplemental O2 via [place]  GI: Soft. Bowel sounds are normal. She exhibits no distension. There is no tenderness.  Genitourinary:  deferred  Musculoskeletal: Normal range of motion. She exhibits edema.  Lymphadenopathy:    She has no cervical adenopathy.  Neurological: She is alert. No cranial nerve deficit. She exhibits normal muscle tone.  Pt is not consistently coherant  Skin: Skin is warm and dry.  Psychiatric: She has a normal mood and affect. Her behavior is normal. Judgment and thought content normal.     Assessment/Plan 81 year old female admitted for pneumonia.  1. Pneumonia-- HCAP; cont Vanc and Zosyn.  Likely secondary to aspiration.  NPO. 2. Dementia--  Contributes to aspiration 3. Sepsis-- criteria via tachycardia, tachypnea, and leukocytosis.  Follow blood cultures for growth and sensitivities 4. DVT pxs-- heparin 5. GI pxs-- none The patient is a DNR.  Time spent of admission orders and patient care approximately 35 minutes  Harrie Foreman 02/09/2015, 2:39 PM

## 2015-02-12 ENCOUNTER — Telehealth: Payer: Self-pay | Admitting: *Deleted

## 2015-02-12 ENCOUNTER — Telehealth: Payer: Self-pay

## 2015-02-12 DIAGNOSIS — N390 Urinary tract infection, site not specified: Secondary | ICD-10-CM | POA: Diagnosis not present

## 2015-02-12 DIAGNOSIS — A419 Sepsis, unspecified organism: Secondary | ICD-10-CM | POA: Diagnosis not present

## 2015-02-12 LAB — CULTURE, BLOOD (ROUTINE X 2)
CULTURE: NO GROWTH
Culture: NO GROWTH

## 2015-02-12 NOTE — Telephone Encounter (Signed)
Transitional care call attempted.  Left message for patient to return call. 

## 2015-02-12 NOTE — Telephone Encounter (Signed)
PLEASE NOTE: All timestamps contained within this report are represented as [place] Standard Time. CONFIDENTIALTY NOTICE: This fax transmission is intended only for the addressee. It contains information that is legally privileged, confidential or otherwise protected from use or disclosure. If you are not the intended recipient, you are strictly prohibited from reviewing, disclosing, copying using or disseminating any of this information or taking any action in reliance on or regarding this information. If you have received this fax in error, please notify us immediately by telephone so that we can arrange for its return to [place]. Phone: [phone], Toll-Free: [phone], Fax: [phone] Page: 1 of 3 Call Id: [id number] [place] [facility] Night - Client TELEPHONE ADVICE RECORD [facility] Patient Name: Kathryn Higgins Gender: Female DOB: 1923-10-12 Age: 81 Y 7 M 3 D Return Phone Number: Address: City/State/Zip: [place] [facility] Night - Client Client Site [facility] - Night Physician Tillman Abide Contact Type Call Call Type Triage / Clinical Caller Name Kendal Hymen, Nurse at [facility] Relationship To Patient Care Giver Return Phone Number Please choose phone number Chief Complaint Paging or Request for Consult Initial Comment Caller states the pt is resistant to the antibiotic right now. A culture was done at the lab. CBN: [phone] Nurse Assessment Nurse: Elaina Hoops RN, Marchelle Folks Date/Time (Eastern Time): 02/10/2015 4:32:28 PM Confirm and document reason for call. If symptomatic, describe symptoms. ---Caller states the patient was discharged from the hospital on Wednesday. She has bilateral pneumonia and a UTI. She was switched to Levaquin. She has had 5 doses of 750 with 3 to go. Her blood cultures came back and showed negative. The culture came back resistant to Levaquin. She does not have any  fever. She is incontinent and not complaining of pain or anything. She is pleasantly confused. Has the patient traveled out of the country within the last 30 days? ---No Does the patient require triage? ---No Guidelines Guideline Title Affirmed Question Affirmed Notes Nurse Date/Time (Eastern Time) Disp. Time Lamount Cohen Time) Disposition Final User 02/10/2015 4:45:29 PM Paged On Call back to [facility] [place] 02/10/2015 4:48:06 PM Clinical Call Yes Elaina Hoops, RN, Marchelle Folks After Care Instructions Given Call Event Type User Date / Time Description Verbal Orders/Maintenance Medications Medication Refill Route Dosage Regime Duration Admin Instructions User Name Macrobid Oral 100 mg 7 Days Patient to take  BID for 7 days for a UTI. Elaina Hoops, RN, Marchelle Folks PLEASE NOTE: All timestamps contained within this report are represented as [place] Standard Time. CONFIDENTIALTY NOTICE: This fax transmission is intended only for the addressee. It contains information that is legally privileged, confidential or otherwise protected from use or disclosure. If you are not the intended recipient, you are strictly prohibited from reviewing, disclosing, copying using or disseminating any of this information or taking any action in reliance on or regarding this information. If you have received this fax in error, please notify us immediately by telephone so that we can arrange for its return to [place]. Phone: [phone], Toll-Free: [phone], Fax: [phone] Page: 2 of 3 Call Id: [id number] Paging DoctorName Phone DateTime Result/Outcome Message Type Notes Hannah Beat [phone] 02/10/2015 4:45:29 PM Paged On Call Back to Call Center Doctor Paged Hannah Beat 02/10/2015 4:46:53 PM Spoke with On Call - General Message Result The nurse gave report to Dr. Patsy Lager and he said to continue the Levaquin and to start Macrobid  BID for 7 days. The nurse spoke with the caller and she  verbalized understanding.  PLEASE NOTE: All timestamps contained within this report are represented as [place] Standard Time. CONFIDENTIALTY NOTICE: This fax transmission is intended only for the addressee. It contains information that is legally privileged, confidential or otherwise protected from use or disclosure. If you are not the intended recipient, you are strictly prohibited from reviewing, disclosing, copying using or disseminating any of this information or taking any action in reliance on or regarding this information. If you have received this fax in error, please notify us immediately by telephone so that we can arrange for its return to [place]. Phone: [phone], Toll-Free: [phone], Fax: [phone] Page: 3 of 3 Call Id: [id number] [facility] [address], [address] [place], [place] [address] [phone] Fax: [phone] MEDICATION ORDER [place] [facility] Night - Client [facility] - Night Date: 02/10/2015 From: QI Department To: Tillman Abide Please sign the order for the approved drug(s) given by our call center nurse on your behalf. Fax to [phone] within 5 business days. Thank you. Date Lamount Cohen Time): 02/10/2015 2:51:07 PM Triage RN: Clemencia Course, RN NAME: Felipa Eth PHONE NUMBER: BIRTHDATE: [birth year] ADDRESS: CITY/STATE/ZIP: [place] CALLER: Care Giver NAME: Kendal Hymen, Nurse at [facility] Rx Given Medication Refill Route Dosage Regime Duration Admin Instructions Macrobid Oral 100 mg 7 Days Patient to take  7 days for a UTI. MD Signature Date

## 2015-02-12 NOTE — Telephone Encounter (Signed)
Macrodantin added Patient seen today

## 2015-02-13 NOTE — Telephone Encounter (Signed)
No TCM needed per provider.  Appointment canceled.

## 2015-02-15 ENCOUNTER — Ambulatory Visit: Payer: Medicare Other | Admitting: Internal Medicine

## 2015-02-26 LAB — TYPE AND SCREEN
ABO/RH(D): A POS
Antibody Screen: NEGATIVE

## 2015-04-13 DIAGNOSIS — L89152 Pressure ulcer of sacral region, stage 2: Secondary | ICD-10-CM | POA: Diagnosis not present

## 2015-04-13 DIAGNOSIS — M3210 Systemic lupus erythematosus, organ or system involvement unspecified: Secondary | ICD-10-CM

## 2015-04-13 DIAGNOSIS — G301 Alzheimer's disease with late onset: Secondary | ICD-10-CM | POA: Diagnosis not present

## 2015-04-16 DIAGNOSIS — R000 Tachycardia, unspecified: Secondary | ICD-10-CM

## 2015-04-16 DIAGNOSIS — G301 Alzheimer's disease with late onset: Secondary | ICD-10-CM | POA: Diagnosis not present

## 2015-05-13 DEATH — deceased

## 2017-06-23 ENCOUNTER — Other Ambulatory Visit: Payer: Self-pay

## 2017-09-12 IMAGING — CR DG CHEST 1V PORT
1 series · 1 of 1 positions shown · non-contrast
Comparison: None.

CLINICAL DATA: Cough and shortness of breath.

EXAM:
PORTABLE CHEST 1 VIEW

[ap]
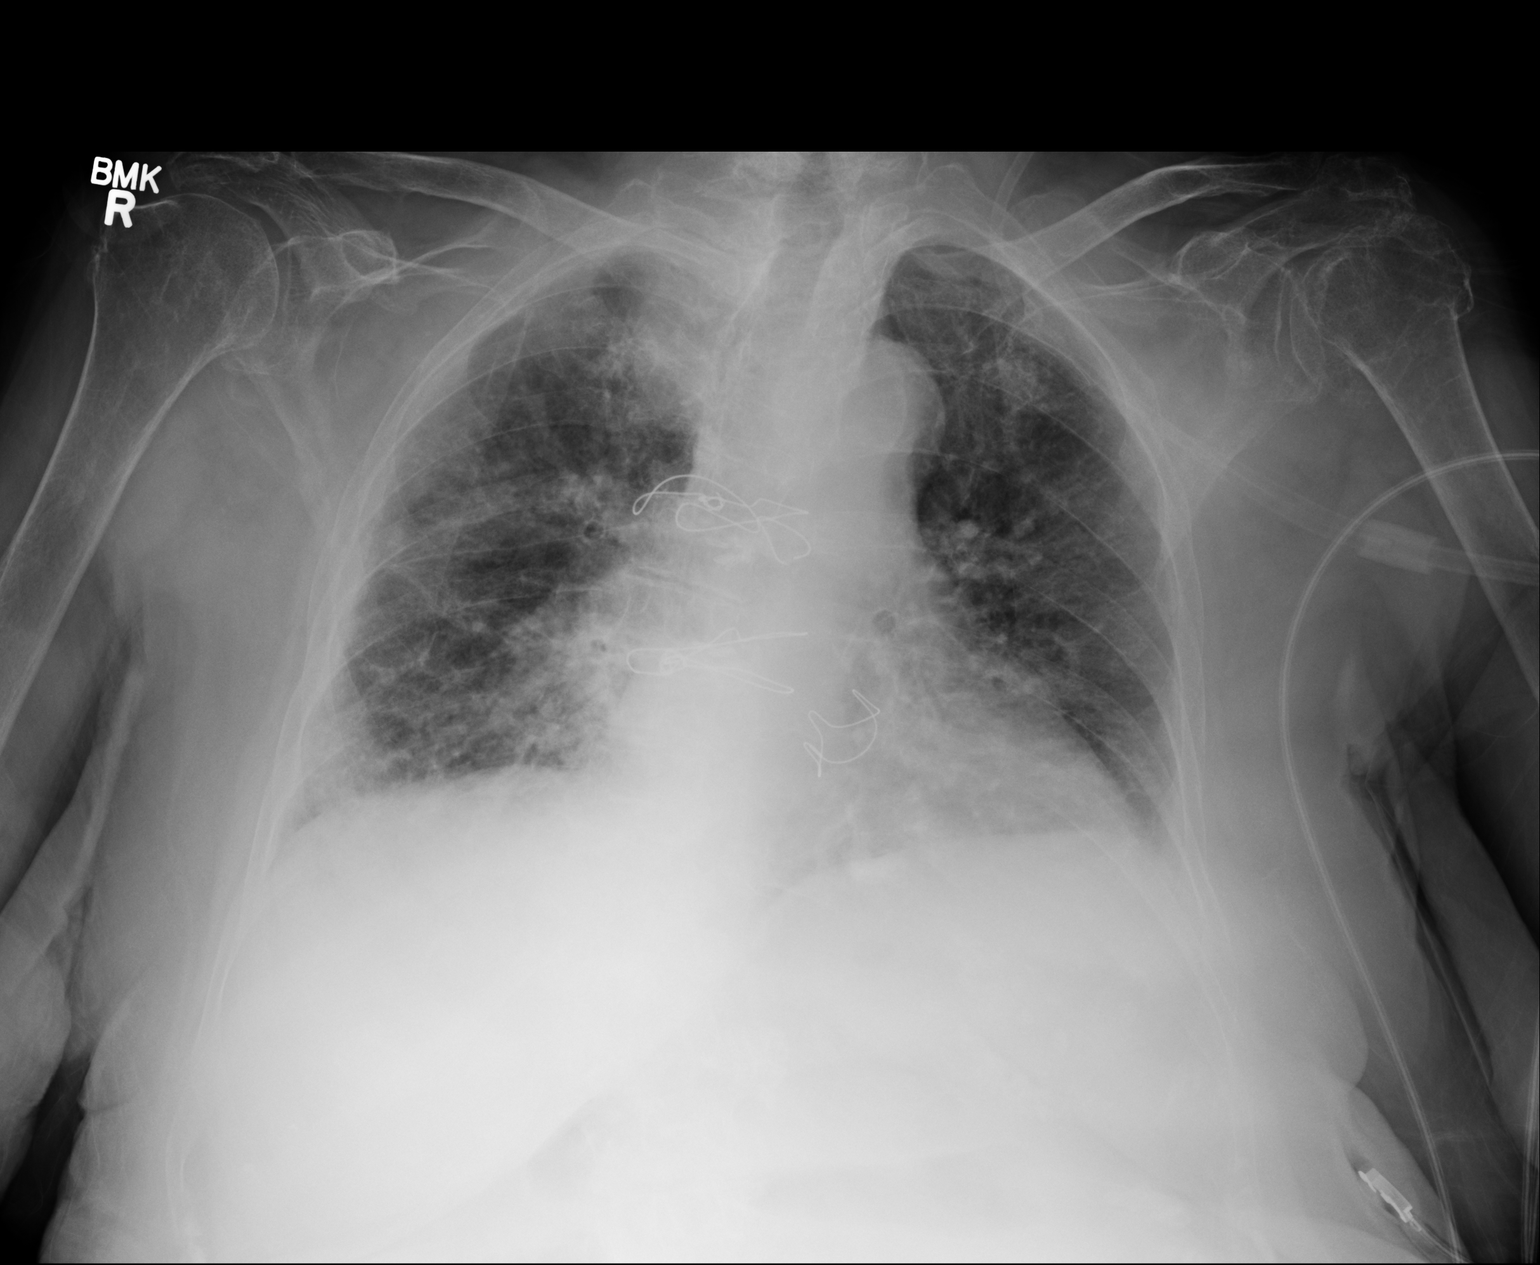

[1 of 1 positions shown; findings below may reference images not displayed]

FINDINGS: Cardiomegaly and mild aortic tortuosity. The patient is status post
aortic valve replacement.

Low lung volumes with indistinct bilateral lung opacities, with
greatest density medially and inferiorly on the right.

Remote proximal left humerus fracture.
IMPRESSION: 1. Patchy bilateral lung opacity, primarily concerning for
pneumonia.
2. Hypoventilation and possible background chronic lung disease.
3. Cardiomegaly.  Aortic valve replacement.
